# Patient Record
Sex: Female | Born: 1955 | Race: White | Hispanic: No | Smoking: Never smoker
Health system: Southern US, Community
[De-identification: ages and names within clinical notes are randomized; demographics above are authoritative.]

## PROBLEM LIST (undated history)

## (undated) DIAGNOSIS — E119 Type 2 diabetes mellitus without complications: Secondary | ICD-10-CM

## (undated) DIAGNOSIS — R519 Headache, unspecified: Secondary | ICD-10-CM

## (undated) DIAGNOSIS — D649 Anemia, unspecified: Secondary | ICD-10-CM

## (undated) DIAGNOSIS — K219 Gastro-esophageal reflux disease without esophagitis: Secondary | ICD-10-CM

## (undated) DIAGNOSIS — E785 Hyperlipidemia, unspecified: Secondary | ICD-10-CM

## (undated) DIAGNOSIS — N200 Calculus of kidney: Secondary | ICD-10-CM

## (undated) DIAGNOSIS — I10 Essential (primary) hypertension: Secondary | ICD-10-CM

## (undated) DIAGNOSIS — F419 Anxiety disorder, unspecified: Secondary | ICD-10-CM

## (undated) DIAGNOSIS — M1990 Unspecified osteoarthritis, unspecified site: Secondary | ICD-10-CM

## (undated) DIAGNOSIS — Z87442 Personal history of urinary calculi: Secondary | ICD-10-CM

## (undated) DIAGNOSIS — R569 Unspecified convulsions: Secondary | ICD-10-CM

## (undated) DIAGNOSIS — E039 Hypothyroidism, unspecified: Secondary | ICD-10-CM

## (undated) HISTORY — PX: APPENDECTOMY: SHX54

## (undated) NOTE — ED Notes (Signed)
 Formatting of this note might be different from the original. Patient is not in the emergency department. AMA completed. Electronically signed by Jeannetta Passy L at 05/24/2024 11:27 PM CDT Electronically signed by Jeannetta Passy L at 05/24/2024 11:31 PM CDT

## (undated) NOTE — ED Notes (Signed)
 Formatting of this note might be different from the original. Bed: AC19 Expected date:  Expected time:  Means of arrival:  Comments: [place], from [facility] Electronically signed by Jasmine Marjorie HERO, RN at 05/25/2024  1:23 AM CDT

## (undated) NOTE — ED Notes (Signed)
 Formatting of this note might be different from the original. Pt is awake and alert GCS 15. Breathing is regular and nonlabored. Skin is warm and dry. Gait is steady with no assistance. Proper discharge clothing. Discharge teaching successful as evidence by no further questions/concerns/needs. Pt ready for discharge.   Electronically signed by Pauline Hedge, RN at 05/25/2024  1:57 AM CDT

## (undated) NOTE — ED Provider Notes (Signed)
 Formatting of this note is different from the original. ED Attending Note  Patient seen as a team with Dr. Franchot and [name] who has also contributed to this note.  History: [name] is a 42 year old female with a past medical history as below is presenting to the ED c/o right wrist pain s/p fall. Pt is from [place] and tripped over a curb and fell onto her right wrist extending it earlier today. Pt denies LOC or blood thinner use. Pt was able to ambulate after the fall.   Patient information was obtained primarily from the patient and past medical records History/Exam limitations: none Interpretor services: N\A  Past Medical History[1] Past Surgical History[2] Social History   Socioeconomic History   Marital status: Widowed    Spouse name: Not on file   Number of children: Not on file   Years of education: Not on file   Highest education level: Not on file  Occupational History   Not on file  Tobacco Use   Smoking status: Never   Smokeless tobacco: Never  Vaping Use   Vaping status: Never Used  Substance and Sexual Activity   Alcohol use: Never   Drug use: Never   Sexual activity: Not on file  Other Topics Concern   Not on file  Social History Narrative   Not on file   Social Drivers of Health   Financial Resource Strain: Low Risk  (04/03/2024)   Received from [facility]   Overall Financial Resource Strain (CARDIA)    Difficulty of Paying Living Expenses: Not hard at all  Food Insecurity: No Food Insecurity (04/03/2024)   Received from [facility]   Hunger Vital Sign    Worried About Running Out of Food in the Last Year: Never true    Ran Out of Food in the Last Year: Never true  Transportation Needs: No Transportation Needs (04/03/2024)   Received from [facility] - Transportation    In the past 12 months, has lack of transportation kept you from medical appointments or from getting medications?:  No    Lack of Transportation (Non-Medical): No  Stress: Not on file  Housing Stability: Low Risk  (04/03/2024)   Received from [facility]   Housing Stability Vital Sign    Unable to Pay for Housing in the Last Year: No    Number of Times Moved in the Last Year: 0    Homeless in the Last Year: No   Review of Systems: Review of Systems  Constitutional:  Negative for chills, fever and malaise/fatigue.  HENT:  Negative for congestion, ear pain and sore throat.   Eyes:  Negative for blurred vision, pain and discharge.  Respiratory:  Negative for cough, shortness of breath and wheezing.   Cardiovascular:  Negative for chest pain and palpitations.  Gastrointestinal:  Negative for abdominal pain, diarrhea, nausea and vomiting.  Genitourinary:  Negative for dysuria, frequency and hematuria.  Musculoskeletal:  Positive for joint pain. Negative for back pain, myalgias and neck pain.       R wrist pain   Skin:  Negative for itching and rash.  Neurological:  Negative for dizziness, tingling, loss of consciousness, weakness and headaches.  Psychiatric/Behavioral:  Negative for depression and suicidal ideas.     No data found. Exam: Physical Exam Vitals and nursing note reviewed.  Constitutional:      General: She is not in acute distress. HENT:  Head: Normocephalic and atraumatic.     Mouth/Throat:     Lips: Pink.     Mouth: Mucous membranes are moist.  Eyes:     Extraocular Movements: Extraocular movements intact.     Conjunctiva/sclera: Conjunctivae normal.     Pupils: Pupils are equal, round, and reactive to light.  Cardiovascular:     Rate and Rhythm: Normal rate and regular rhythm.     Pulses:          Radial pulses are 2+ on the right side and 2+ on the left side.       Dorsalis pedis pulses are 2+ on the right side and 2+ on the left side.     Heart sounds: No murmur heard.    No friction rub. No gallop.  Pulmonary:     Effort: Pulmonary effort is normal.  No respiratory distress.     Breath sounds: Normal breath sounds. No wheezing.  Abdominal:     General: Abdomen is flat. Bowel sounds are normal. There is no distension.     Palpations: Abdomen is soft.     Tenderness: There is no abdominal tenderness.  Musculoskeletal:        General: No deformity. Normal range of motion.     Cervical back: Normal range of motion and neck supple.     Comments: Mild TTP over r wrist. No anatomical snuff box TTP. No acute focal neurological deficits.   Skin:    General: Skin is warm and dry.     Findings: No bruising.  Neurological:     Mental Status: She is alert and oriented to person, place, and time.     Comments: MAE spontaneously and equally.  Psychiatric:        Mood and Affect: Mood normal.        Behavior: Behavior normal.   Medical Decision Making:  40. 52 year old female with above history brought for evaluation of right wrist pain s/p fall.    DDX: ICH vs fx vs dislocation vs soft tissue injury vs other  Plan: review imaging, place in wrist brace and dc with PCP follow up  See below for further orders/plan  Data Review: Amount and/or Complexity of Data Reviewed: Patient information was obtained primarily from the patient and past medical records  Social determinants of health: No - Limiting social determinants of health: None.    Amount and/or Complexity of Data Reviewed: - medical complexity: medical complexity, Triage notes and available nursing notes reviewed , Tests in the radiology section of CPT: ordered and independent interpretation, Independent visualization of images: yes, and Review and summarize past medical records: yes   - Monitoring: The patient's Cardiac Monitor Rhythm was interpreted by me.  The telemetry monitor showed NSR. This is interpreted as normal.  The patient's Oxygen Saturation Monitor was interpreted by me. The reading was 99%. The patient was on room air at the time of the reading. This is interpreted  as normal.  All Labs/Imaging/ECG, other diagnostics independently interpreted by me.  - LABS: Labs Reviewed - No data to display  - IMAGING: CT HEAD WO CONTRAST  Final Result  PROCEDURE:  CT HEAD WO CONTRAST, CT FACIAL BONES WO CONTRAST, CT CERVICAL  SPINE WO CONTRAST, DATE/TIME OF EXAM:  05/24/2024 8:21 PM, LOCATION  [facility]   INDICATION:  [email or web address] Casino, initial encounter   ADDITIONAL CLINICAL INFORMATION:  Ordering Provider Reason For Exam:  r/o fx, bleed (accession [id number]),  r/o fx (accession  [id number])  Technologist Note:  Additional:    EXAMINATION:    1. Computed tomography (CT) of the head without contrast  2. CT of the maxillofacial bones, orbits, and paranasal sinuses without  contrast  3. CT of the cervical spine without contrast    TECHNIQUE: CT of the head, cervical spine, and maxillofacial bones,   orbits,  and paranasal sinuses was performed without contrast according to standard  protocol.   COMPARISON: No prior study is available for comparison at the time of this  dictation.   FINDINGS:   Head:   No acute intracranial hemorrhage or intra- or extra-axial fluid   collections  are identified. The ventricles are of normal size, shape, and morphology.  The basal cisterns are patent. No mass effect or midline shift is seen.   The  gray-white matter differentiation is normal. There is vascular  calcification of the carotid siphons.    No acute calvarial fracture is identified.   Maxillofacial:   No soft tissue abnormality is identified.    The orbits including the globes, optic nerves, retrobulbar fat and  extraocular muscles appear normal. The paranasal sinuses are clear. The  hard palate, mandible, and temporomandibular joints appear normal. The  mastoid air cells are clear.    Cervical spine:   No soft tissue abnormality is identified.    The cervical spine is straightened with loss of cervical lordosis. The   prevertebral soft tissue is normal in thickness. The mineralization of the  bones is normal. Vertebral bodies are normal in height without evidence of  acute fracture. Other than middle atlantoaxial joint osteoarthritis, the  craniocervical junction appears normal. 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 The central canal is patent. There  are varying degrees of mild facet osteoarthritis. There are varying   degrees  of mild to moderate multilevel uncovertebral joint osteoarthritis with the  same degree of neural foraminal stenosis at these levels.    IMPRESSION:   1. No acute intracranial process.   2. No acute facial bone fractures identified.   3. No evidence of acute fracture in the cervical spine.   > Interpreting Provider: Pixie Call, MD on 05/24/2024 9:01 PM   CT CERVICAL SPINE WO CONTRAST  Final Result  PROCEDURE:  CT HEAD WO CONTRAST, CT FACIAL BONES WO CONTRAST, CT CERVICAL  SPINE WO CONTRAST, DATE/TIME OF EXAM:  05/24/2024 8:21 PM, LOCATION  [facility]   INDICATION:  [email or web address] Casino, initial encounter   ADDITIONAL CLINICAL INFORMATION:  Ordering Provider Reason For Exam:  r/o fx, bleed (accession [id number]),  r/o fx (accession [id number])  Technologist Note:  Additional:    EXAMINATION:    1. Computed tomography (CT) of the head without contrast  2. CT of the maxillofacial bones, orbits, and paranasal sinuses without  contrast  3. CT of the cervical spine without contrast    TECHNIQUE: CT of the head, cervical spine, and maxillofacial bones,   orbits,  and paranasal sinuses was performed without contrast according to standard  protocol.   COMPARISON: No prior study is available for comparison at the time of this  dictation.   FINDINGS:   Head:   No acute intracranial hemorrhage or intra- or extra-axial fluid   collections  are identified. The ventricles are of normal size, shape, and morphology.  The basal  cisterns are patent. No mass effect or midline shift is seen.   The  gray-white matter differentiation is normal. There is vascular  calcification of the carotid siphons.    No acute calvarial fracture is identified.   Maxillofacial:   No soft tissue abnormality is identified.    The orbits including the globes, optic nerves, retrobulbar fat and  extraocular muscles appear normal. The paranasal sinuses are clear. The  hard palate, mandible, and temporomandibular joints appear normal. The  mastoid air cells are clear.    Cervical spine:   No soft tissue abnormality is identified.    The cervical spine is straightened with loss of cervical lordosis. The  prevertebral soft tissue is normal in thickness. The mineralization of the  bones is normal. Vertebral bodies are normal in height without evidence of  acute fracture. Other than middle atlantoaxial joint osteoarthritis, the  craniocervical junction appears normal. 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 The central canal is patent. There  are varying degrees of mild facet osteoarthritis. There are varying   degrees  of mild to moderate multilevel uncovertebral joint osteoarthritis with the  same degree of neural foraminal stenosis at these levels.    IMPRESSION:   1. No acute intracranial process.   2. No acute facial bone fractures identified.   3. No evidence of acute fracture in the cervical spine.   > Interpreting Provider: Pixie Call, MD on 05/24/2024 9:01 PM   CT Facial Bones Wo Contrast  Final Result  PROCEDURE:  CT HEAD WO CONTRAST, CT FACIAL BONES WO CONTRAST, CT CERVICAL  SPINE WO CONTRAST, DATE/TIME OF EXAM:  05/24/2024 8:21 PM, LOCATION  [facility]   INDICATION:  [email or web address] Casino, initial encounter   ADDITIONAL CLINICAL INFORMATION:  Ordering Provider Reason For Exam:  r/o fx, bleed (accession [id number]),  r/o fx (accession [id number])  Technologist Note:   Additional:    EXAMINATION:    1. Computed tomography (CT) of the head without contrast  2. CT of the maxillofacial bones, orbits, and paranasal sinuses without  contrast  3. CT of the cervical spine without contrast    TECHNIQUE: CT of the head, cervical spine, and maxillofacial bones,   orbits,  and paranasal sinuses was performed without contrast according to standard  protocol.   COMPARISON: No prior study is available for comparison at the time of this  dictation.   FINDINGS:   Head:   No acute intracranial hemorrhage or intra- or extra-axial fluid   collections  are identified. The ventricles are of normal size, shape, and morphology.  The basal cisterns are patent. No mass effect or midline shift is seen.   The  gray-white matter differentiation is normal. There is vascular  calcification of the carotid siphons.    No acute calvarial fracture is identified.   Maxillofacial:   No soft tissue abnormality is identified.    The orbits including the globes, optic nerves, retrobulbar fat and  extraocular muscles appear normal. The paranasal sinuses are clear. The  hard palate, mandible, and temporomandibular joints appear normal. The  mastoid air cells are clear.    Cervical spine:   No soft tissue abnormality is identified.    The cervical spine is straightened with loss of cervical lordosis. The  prevertebral soft tissue is normal in thickness. The mineralization of the  bones is normal. Vertebral bodies are normal in height without evidence of  acute fracture. Other than middle atlantoaxial joint osteoarthritis, the  craniocervical junction appears normal. 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 The central canal is patent. There  are varying  degrees of mild facet osteoarthritis. There are varying   degrees  of mild to moderate multilevel uncovertebral joint osteoarthritis with the  same degree of neural foraminal stenosis at these  levels.    IMPRESSION:   1. No acute intracranial process.   2. No acute facial bone fractures identified.   3. No evidence of acute fracture in the cervical spine.   > Interpreting Provider: Pixie Call, MD on 05/24/2024 9:01 PM   XR WRIST RIGHT 3VW OR MORE    (Results Pending)  XR HAND RIGHT 3VW OR MORE    (Results Pending)  XR CHEST 2VW    (Results Pending)   CT HEAD WO CONTRAST Result Date: 05/24/2024 IMPRESSION: 1. No acute intracranial process. 2. No acute facial bone fractures identified. 3. No evidence of acute fracture in the cervical spine. > Interpreting Provider: Pixie Call, MD on 05/24/2024 9:01 PM  CT CERVICAL SPINE WO CONTRAST Result Date: 05/24/2024 IMPRESSION: 1. No acute intracranial process. 2. No acute facial bone fractures identified. 3. No evidence of acute fracture in the cervical spine. > Interpreting Provider: Pixie Call, MD on 05/24/2024 9:01 PM  CT Facial Bones Wo Contrast Result Date: 05/24/2024 IMPRESSION: 1. No acute intracranial process. 2. No acute facial bone fractures identified. 3. No evidence of acute fracture in the cervical spine. > Interpreting Provider: Pixie Call, MD on 05/24/2024 9:01 PM  - MEDS:  Medications  acetaminophen  (Tylenol ) tablet 1,000 mg (1,000 mg Oral $ Given 05/24/24 1957)   ED COURSE: All Labs/Imaging/ECG, other diagnostics independently interpreted by me.  ED Course as of 05/25/24 0457  Fri May 25, 2024  0149 I have reviewed her diagnostic findings and she has had an opportunity to ask me any questions she has about care, diagnosis and discharge plan. Patient is comfortable with the discharge plan. She will follow up as directed and will return to the ER if her condition worsens or she develops other urgent concerns. [VV]   ED Course User Index [VV] Courtland Elden SAILOR    Clinical Impressions as of 05/25/24 0457  Fall, initial encounter  Right hand pain  Hypertension, unspecified type   Consult No  Procedure done at this  time No  Ultrasound done at this time No  Medications given in ED: Yes - see MAR  cct   Conclusion and Disposition:   Acute problems: r wrist pain, fall  Exacerbations of chronic problems: n/a  Systemic issues: n/a   Consultations in the ED: n/a  Medication changes: n/a  Follow up: PCP    Orders Placed This Encounter   XR WRIST RIGHT 3VW OR MORE   XR HAND RIGHT 3VW OR MORE   XR CHEST 2VW   CT HEAD WO CONTRAST   CT CERVICAL SPINE WO CONTRAST   CT Facial Bones Wo Contrast   acetaminophen  (Tylenol ) tablet 1,000 mg   Medications  acetaminophen  (Tylenol ) tablet 1,000 mg (1,000 mg Oral $ Given 05/24/24 1957)   Clinical Impression: 1. Fall, initial encounter   2. Right hand pain   3. Hypertension, unspecified type    Disposition:  Discharged   By signing my name below, I, Elden Courtland, attest that this documentation has been prepared under the direction and in the presence of Dr. Hunt. Signed: Varun Vadhera, Scribe.  I, Dr. Hunt, personally performed the services described in this documentation. All medical record entries made by the scribe were at my direction and in my presence. I have reviewed the chart and agree that the  record reflects my personal performance and is accurate and complete. Electronically signed: Dr. Hunt.    [1] No past medical history on file. [2] No past surgical history on file.  Electronically signed by Hunt Elspeth LABOR, MD at 05/25/2024  5:51 AM CDT

## (undated) NOTE — ED Notes (Signed)
 Formatting of this note is different from the original. Medical Screening Exam  05/24/2024 7:46 PM    Provider contact with the patient [name] FMW:[id number]  CC: Fall (Pt bibSELF after tripping over a buckled part of the sidewalk earlier this morning.. Patient c/o R wrist and R rib pain. Denis LOC or BT use. Pt arrives A&Ox4, VSS, ambulatory in triage. )  **Chief complaint narrative was entered by triage nurse, not by provider**  Provider in Triage HPI:  [name] is a 47 year old female PMH as noted below who presents with fall this morning. States she tripped over the sidewalk. Pt states she fell forward hitting the side of her head. Pt states was able to catch herself with her hands but has increased pain in her right wrist and right side of her chest along her ribs.  Denies LOC, blood thinner.  Denies any other medical history.  Patient states she is from the [place] and is currently traveling and visiting [place].  Denies any other symptoms at this time.  Denies taking medications for pain. Last tetanus was 4 yrs ago     Limited Chart History:  Past Medical History[1]  Past Surgical History[2]  Medications[3]  Allergies[4]  PCP:  No primary care provider on file.  (Above may be pending completion)   Review of Systems: Primary System Noted in HPI. All other systems reviewed and are negative.  Vital Signs reviewed in Triage BP 152/79   Pulse 83   Temp 97.6 F (36.4 C)   Resp 17   Ht 1.676 m (5' 6)   Wt 93 kg (205 lb)   SpO2 99%   Pertinent Physical Findings:  Constitutional: vitals as above, sitting comfortably in chair in no acute distress, friendly, calm, well groomed Head: Head normocephalic, small abrasion to the R lateral aspect of eyebrow not bleeding, tender to R frontal lobe  Eyes: conjunctiva clear ENT: no rhinorrhea Neck: neck supple Resp: respirations even and unlabored, lungs clear bilaterally, no wheezing, stridor,  rhonchi  CV: Heart RRR, no m/c/r/g, tender to R side of chest  Abd: nondistended,nontender Skin: warm, dry,color normal for ethnicity MSK: ambulatory, moves all extremities, decreased ROM of R wrist with tenderness to lateral aspect radiating to hand. Full ROM of R fingers  Neuro: A&O x 3 Psych: Normal affect  **Complete physical exam is limited due to patient sitting in up right position in chair**  MDM: I have reviewed all lab and imaging resulted ordered during this visit and available at the time of this note.  Triage notes and available nursing notes reviewed.  Previous medical record reviewed when available.  Management options include but not limited to: physical exam, laboratory testing, discussion with other providers.   MEDICATIONS FOR CURRENT ENCOUNTER:  SCHEDULED MEDICATIONS:                 No current facility-administered medications for this encounter.  CONTINUOUS MEDICATIONS:               No current facility-administered medications for this encounter.  PRN MEDICATIONS:                         No current facility-administered medications for this encounter.  Clinical Impression:  1.mechanical fall, R wrist pain, R frontal lobe pain   Based on the Medical Screening Exam performed and diagnostic tests at this time, further evaluation is indicated and will be performed.  Patient will  be transferred to a main ED room when one is available and care will be transferred to ER provider.  Harrold Motto, PA-C     [1] No past medical history on file. [2] No past surgical history on file. [3]  No current facility-administered medications for this encounter.   No current outpatient medications on file.  [4] No Known Allergies  Electronically signed by Motto Harrold, PA-C at 05/24/2024  7:58 PM CDT

---

## 1986-11-29 HISTORY — PX: LAPAROSCOPY FOR ECTOPIC PREGNANCY: SUR765

## 2005-03-11 ENCOUNTER — Ambulatory Visit: Payer: Self-pay | Admitting: Unknown Physician Specialty

## 2005-11-29 HISTORY — PX: ABDOMINAL HYSTERECTOMY: SHX81

## 2005-11-29 LAB — CONVERTED CEMR LAB: Pap Smear: NORMAL

## 2006-06-21 ENCOUNTER — Ambulatory Visit: Payer: Self-pay | Admitting: Unknown Physician Specialty

## 2006-08-24 ENCOUNTER — Encounter (INDEPENDENT_AMBULATORY_CARE_PROVIDER_SITE_OTHER): Payer: Self-pay | Admitting: Specialist

## 2006-08-24 ENCOUNTER — Ambulatory Visit (HOSPITAL_COMMUNITY): Admission: RE | Admit: 2006-08-24 | Discharge: 2006-08-25 | Payer: Self-pay | Admitting: Obstetrics and Gynecology

## 2007-01-02 ENCOUNTER — Ambulatory Visit: Payer: Self-pay | Admitting: Gastroenterology

## 2007-01-02 ENCOUNTER — Encounter: Payer: Self-pay | Admitting: Family Medicine

## 2007-03-08 ENCOUNTER — Ambulatory Visit: Payer: Self-pay | Admitting: Unknown Physician Specialty

## 2007-12-28 ENCOUNTER — Ambulatory Visit: Payer: Self-pay | Admitting: Unknown Physician Specialty

## 2008-06-04 ENCOUNTER — Ambulatory Visit: Payer: Self-pay | Admitting: Unknown Physician Specialty

## 2008-12-09 ENCOUNTER — Ambulatory Visit: Payer: Self-pay | Admitting: Family Medicine

## 2008-12-09 DIAGNOSIS — Z87442 Personal history of urinary calculi: Secondary | ICD-10-CM | POA: Insufficient documentation

## 2008-12-09 DIAGNOSIS — G43009 Migraine without aura, not intractable, without status migrainosus: Secondary | ICD-10-CM | POA: Insufficient documentation

## 2009-02-10 ENCOUNTER — Ambulatory Visit: Payer: Self-pay | Admitting: Family Medicine

## 2009-02-10 LAB — CONVERTED CEMR LAB
ALT: 22 units/L (ref 0–35)
AST: 22 units/L (ref 0–37)
Albumin: 3.9 g/dL (ref 3.5–5.2)
Alkaline Phosphatase: 83 units/L (ref 39–117)
BUN: 21 mg/dL (ref 6–23)
CO2: 32 meq/L (ref 19–32)
Chloride: 105 meq/L (ref 96–112)
Cholesterol: 167 mg/dL (ref 0–200)
Creatinine, Ser: 0.8 mg/dL (ref 0.4–1.2)
Glucose, Bld: 84 mg/dL (ref 70–99)
Potassium: 4.4 meq/L (ref 3.5–5.1)
Triglycerides: 51 mg/dL (ref 0–149)

## 2009-02-11 ENCOUNTER — Ambulatory Visit: Payer: Self-pay | Admitting: Family Medicine

## 2009-03-03 ENCOUNTER — Encounter: Payer: Self-pay | Admitting: Family Medicine

## 2009-03-03 ENCOUNTER — Ambulatory Visit: Payer: Self-pay | Admitting: Family Medicine

## 2009-03-05 ENCOUNTER — Encounter (INDEPENDENT_AMBULATORY_CARE_PROVIDER_SITE_OTHER): Payer: Self-pay | Admitting: *Deleted

## 2009-04-22 ENCOUNTER — Ambulatory Visit: Payer: Self-pay | Admitting: Family Medicine

## 2009-07-22 ENCOUNTER — Telehealth: Payer: Self-pay | Admitting: Family Medicine

## 2010-01-23 DIAGNOSIS — Z860100 Personal history of colon polyps, unspecified: Secondary | ICD-10-CM | POA: Insufficient documentation

## 2010-02-19 ENCOUNTER — Encounter: Payer: Self-pay | Admitting: Family Medicine

## 2010-03-12 ENCOUNTER — Ambulatory Visit: Payer: Self-pay | Admitting: Gastroenterology

## 2010-03-12 ENCOUNTER — Encounter: Payer: Self-pay | Admitting: Family Medicine

## 2010-05-11 ENCOUNTER — Telehealth: Payer: Self-pay | Admitting: Family Medicine

## 2010-06-02 ENCOUNTER — Encounter: Payer: Self-pay | Admitting: Family Medicine

## 2010-06-02 ENCOUNTER — Ambulatory Visit: Payer: Self-pay | Admitting: Family Medicine

## 2010-06-04 ENCOUNTER — Ambulatory Visit: Payer: Self-pay | Admitting: Family Medicine

## 2010-06-04 ENCOUNTER — Encounter: Payer: Self-pay | Admitting: Family Medicine

## 2010-06-09 ENCOUNTER — Encounter: Payer: Self-pay | Admitting: Family Medicine

## 2010-08-14 ENCOUNTER — Telehealth (INDEPENDENT_AMBULATORY_CARE_PROVIDER_SITE_OTHER): Payer: Self-pay | Admitting: *Deleted

## 2010-08-18 ENCOUNTER — Ambulatory Visit: Payer: Self-pay | Admitting: Family Medicine

## 2010-08-19 LAB — CONVERTED CEMR LAB
ALT: 25 units/L (ref 0–35)
AST: 33 units/L (ref 0–37)
Alkaline Phosphatase: 83 units/L (ref 39–117)
BUN: 20 mg/dL (ref 6–23)
Chloride: 102 meq/L (ref 96–112)
Cholesterol: 193 mg/dL (ref 0–200)
GFR calc non Af Amer: 71.13 mL/min (ref >60)
Glucose, Bld: 87 mg/dL (ref 70–99)
LDL Cholesterol: 123 mg/dL — ABNORMAL HIGH (ref 0–99)
Potassium: 4.7 meq/L (ref 3.5–5.1)
Sodium: 139 meq/L (ref 135–145)
Total Bilirubin: 0.8 mg/dL (ref 0.3–1.2)
VLDL: 8.4 mg/dL (ref 0.0–40.0)

## 2010-08-25 ENCOUNTER — Ambulatory Visit: Payer: Self-pay | Admitting: Family Medicine

## 2010-08-25 LAB — CONVERTED CEMR LAB

## 2010-09-25 ENCOUNTER — Encounter (INDEPENDENT_AMBULATORY_CARE_PROVIDER_SITE_OTHER): Payer: Self-pay | Admitting: *Deleted

## 2010-12-29 NOTE — Consult Note (Signed)
Summary: [facility]  [facility]   Imported By: Lanelle Bal 03/03/2010 14:22:36  _____________________________________________________________________  External Attachment:    Type:   Image     Comment:   External Document

## 2010-12-29 NOTE — Miscellaneous (Signed)
Summary: FLU VAC ([facility])  Clinical Lists Changes  Observations: Added new observation of FLU VAX: Historical([facility]) (09/04/2010 13:37)      Immunization History:  Influenza Immunization History:    Influenza:  historical([facility]) (09/04/2010)

## 2010-12-29 NOTE — Progress Notes (Signed)
Summary: needs mammogram  Phone Note Call from Patient Call back at Home Phone [phone]   Caller: Patient Call For: Kerby Nora MD Summary of Call: Patient says that it is time for her yearly mammogram and needs an order for it. She goes to [facility]. Please advise.  Initial call taken by: Melody Comas,  May 11, 2010 2:20 PM  Follow-up for Phone Call        Referral sent.  Follow-up by: Kerby Nora MD,  May 11, 2010 2:21 PM

## 2010-12-29 NOTE — Progress Notes (Signed)
----   Converted from flag ---- ---- 08/13/2010 10:00 AM, Kerby Nora MD wrote: CMET, lipids Dx v77.91  ---- 08/13/2010 9:40 AM, Liane Comber CMA (AAMA) wrote: Lab orders please! Good Morning! This pt is scheduled for cpx labs Tuesday, which labs to draw and dx codes to use? Thanks Tasha ------------------------------

## 2010-12-29 NOTE — Assessment & Plan Note (Signed)
Summary: CPX/CLE   Vital Signs:  Patient profile:   55 year old female Height:      67 inches Weight:      186.0 pounds BMI:     29.24 Temp:     98.1 degrees F oral Pulse rate:   80 / minute Pulse rhythm:   regular BP sitting:   100 / 60  (left arm) Cuff size:   regular  Vitals Entered By: Benny Lennert CMA Duncan Dull) (August 25, 2010 11:06 AM)  History of Present Illness: Chief complaint cpx  The patient is here for annual wellness exam and preventative care.    20 lb weight gain in last year. Now eating better.. in last 2 weeks walking 3-4 miles daily.  Migraine .Tonia Ghent a month...now off keppra..using frova as needed only.   Problems Prior to Update: 1)  Colonic Polyps, Adenomatous, Hx of  (ICD-V12.72) 2)  Routine Gynecological Examination  (ICD-V72.31) 3)  Well Adult Exam  (ICD-V70.0) 4)  Other Screening Mammogram  (ICD-V76.12) 5)  Screening For Lipoid Disorders  (ICD-V77.91) 6)  Family History Breast Cancer 1st Degree Relative <50  (ICD-V16.3) 7)  Family History Diabetes 1st Degree Relative  (ICD-V18.0) 8)  Hx of Nephrolithiasis, Hx of  (ICD-V13.01) 9)  Migraine, Common  (ICD-346.10)  Current Medications (verified): 1)  Frova 2.5 Mg Tabs (Frovatriptan Succinate) .... At Onset of Migraine  Allergies (verified): No Known Drug Allergies  Past History:  Past medical, surgical, family and social histories (including risk factors) reviewed, and no changes noted (except as noted below).  Past Surgical History: Reviewed history from 12/09/2008 and no changes required. Hysterectomy, partial Has both ovaries; for uterine prolapse 2007 Appendectomy eptopic preg 1988   Family History: Reviewed history from 12/09/2008 and no changes required. father: neck cancer, unknow type but very aggressive mother:  CVA, Dm, HTN Family History Diabetes 1st degree relative Family History High cholesterol Family History Hypertension sister breast cancer Family History Breast  cancer 1st degree relative <50  Social History: Reviewed history from 12/09/2008 and no changes required. 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 CV:  Denies chest pain or discomfort. Resp:  Denies shortness of breath. GI:  Denies abdominal pain, bloody stools, constipation, and diarrhea. GU:  Denies abnormal vaginal bleeding and dysuria.  Physical Exam  General:  overweight appearing female in NAD Eyes:  No corneal or conjunctival inflammation noted. EOMI. Perrla. Funduscopic exam benign, without hemorrhages, exudates or papilledema. Vision grossly normal. Nose:  External nasal examination shows no deformity or inflammation. Nasal mucosa are pink and moist without lesions or exudates. Mouth:  Oral mucosa and oropharynx without lesions or exudates.  Teeth in good repair. Neck:  no carotid bruit or thyromegaly no cervical or supraclavicular lymphadenopathy  Breasts:  No mass, nodules, thickening, tenderness, bulging, retraction, inflamation, nipple discharge or skin changes noted.   Lungs:  Normal respiratory effort, chest expands symmetrically. Lungs are clear to auscultation, no crackles or wheezes. Heart:  Normal rate and regular rhythm. S1 and S2 normal without gallop, murmur, click, rub or other extra sounds. Abdomen:  Bowel sounds positive,abdomen soft and non-tender without masses, organomegaly or hernias noted. Msk:  No deformity or scoliosis noted of thoracic or lumbar spine.   Pulses:  R and L posterior tibial pulses are full and equal bilaterally  Extremities:  no edmea  Neurologic:  No cranial nerve  deficits noted. Station and gait are normal. Sensory, motor and coordinative functions appear intact. Skin:  Intact without suspicious lesions or rashes Psych:  Cognition and judgment  appear intact. Alert and cooperative with normal attention span and concentration. No apparent delusions, illusions, hallucinations   Impression & Recommendations:  Problem # 1:  WELL ADULT EXAM (ICD-V70.0) The patient's preventative maintenance and recommended screening tests for an annual wellness exam were reviewed in full today. Brought up to date unless services declined.  Counselled on the importance of diet, exercise, and its role in overall health and mortality. The patient's FH and SH was reviewed, including their home life, tobacco status, and drug and alcohol status.     Problem # 2:  ROUTINE GYNECOLOGICAL EXAMINATION (ICD-V72.31) DVE no pap.   Complete Medication List: 1)  Frova 2.5 Mg Tabs (Frovatriptan succinate) .... At onset of migraine  Current Allergies (reviewed today): No known allergies    Past Surgical History:    Reviewed history from 12/09/2008 and no changes required:       Hysterectomy, partial Has both ovaries; for uterine prolapse 2007       Appendectomy       eptopic preg 1988    Flu Vaccine Next Due:  Refused Last PAP:  Normal (11/29/2005 10:24:14 AM) PAP Result Date:  08/25/2010 PAP Result:  DVE no pap, has B ovaries

## 2010-12-29 NOTE — Procedures (Signed)
Summary: Colonoscopy by Dr.Paul [facility]  Colonoscopy by Dr.Paul Oh,[facility]   Imported By: Beau Fanny 03/17/2010 15:24:35  _____________________________________________________________________  External Attachment:    Type:   Image     Comment:   External Document  Appended Document: Orders Update    Clinical Lists Changes  Observations: Added new observation of COLONNXTDUE: 03/13/2015 (03/18/2010 23:40) Added new observation of LST COLON DT: 03/12/2010 (03/12/2010 23:40) Added new observation of COLONOSCOPY: normal (03/12/2010 23:40)      Last Colonoscopy:  Adenomatous Polyp (01/03/2007 10:25:44 AM) Colonoscopy Result Date:  03/12/2010 Colonoscopy Result:  normal Colonoscopy Next Due:  5 yr

## 2011-04-16 NOTE — Discharge Summary (Signed)
[name], [name]            ACCOUNT NO.:  [id number]   MEDICAL RECORD NO.:  [id number]          PATIENT TYPE:  OIB   LOCATION:  9311                          FACILITY:  [facility]   PHYSICIAN:  Richardean Sale, M.D.   DATE OF BIRTH:  10/18/1956   DATE OF ADMISSION:  08/24/2006  DATE OF DISCHARGE:  08/25/2006                                 DISCHARGE SUMMARY   ADMITTING DIAGNOSES:  1. Uterine prolapse.  2. Symptomatic cystocele and rectocele.   POSTOPERATIVE DIAGNOSES:  1. Uterine prolapse.  2. Symptomatic cystocele and rectocele.  3. Status post total vaginal hysterectomy with anterior and posterior      repair.  4. Bradycardia.   HOSPITAL COURSE AND HISTORY OF PRESENT ILLNESS:  Please see the patient's  admission history and physical for details.  Briefly, this is a 55 year old  white female with symptomatic uterine prolapse, cystocele and rectocele who  underwent an uncomplicated total vaginal hysterectomy and anterior-posterior  repair on August 24, 2006.  In the operating room, it was noted that the  patient was bradycardic from the start of the procedure with heart rate in  the 40s.  Preoperatively she ran in the 50s to 60s.  She remained in the 40s  to 60s throughout her postoperative course but was completely asymptomatic  with stable blood pressures.  She was evaluated by cardiology during the  admission who thought this was simply sinus bradycardia and their evaluation  required no intervention while she was in the hospital.  From a surgical  standpoint she remained stable throughout her hospitalization.  On postop  day #1 she was ambulating well, was voiding without difficulty and her pain  was controlled with oral pain medications and she was tolerating a regular  diet.  She was subsequently discharged to home on postop day #1 a good  condition.   DISPOSITION:  To home.   CONDITION:  Improved.   FOLLOWUP:  She will follow up in 4 weeks for routine postoperative  check.   INSTRUCTIONS:  The patient is to call for any heavy vaginal bleeding, pain  not relieved with pain medications or inability or difficulty voiding.  In  addition she is to follow up with her primary care physician regarding the  bradycardia as instructed by the cardiologist.   MEDICATIONS:  1. Percocet 1-2 tabs p.o. q.4-6 hours p.r.n. pain.  2. Motrin 800 mg p.o. q.8 hours p.r.n.      Richardean Sale, M.D.  Electronically Signed     JW/MEDQ  D:  08/27/2006  T:  08/29/2006  Job:  [id number]

## 2011-04-16 NOTE — Op Note (Signed)
[name], [name]            ACCOUNT NO.:  [id number]   MEDICAL RECORD NO.:  [id number]          PATIENT TYPE:  AMB   LOCATION:  SDC                           [facility]ACILITY:  [facility]   PHYSICIAN:  [name], M.D.   DATE OF BIRTH:  October 27, 1956   DATE OF PROCEDURE:  08/24/2006  DATE OF DISCHARGE:                                 OPERATIVE REPORT   PREOPERATIVE DIAGNOSES:  1. Uterine prolapse.  2. Symptomatic cystocele.  3. Rectocele.   POSTOPERATIVE DIAGNOSES:  1. Uterine prolapse.  2. Symptomatic cystocele.  3. Rectocele.   PROCEDURES:  1. Total vaginal hysterectomy.  2. Anterior and posterior repair.   SURGEON:  [name], M.D.   ASSISTANT:  [name], M.D.   ANESTHESIA:  General.   COMPLICATIONS:  None.   ESTIMATED BLOOD LOSS:  150 cc.   OPERATIVE FINDINGS:  Normal-appearing uterus and cervix sent to pathology,  second-degree cystocele and third degree rectocele.   INDICATIONS:  This is a 55 year old para 1 white female who has had  progressively worsening symptoms complaining of a bulge in the vagina as  well as pelvic pressure.  She has uterine prolapse as well as a cystocele  and rectocele.  She presents today for total vaginal hysterectomy and  anterior and posterior repair.  Prior to procedure, the risks of the  procedure were reviewed with the patient in detail.  We discussed the risks  to include, but are not limited to, hemorrhage requiring transfusion,  infection, injury to the bowel or bladder which may require additional  surgery either at the time of this procedure or in the future.  Reviewed the  risk of anesthesia including DVT and pulmonary embolus.  We also discussed  the possibility of needing additional surgery in the future, given her early  age of onset of symptomatic prolapse.  The patient voiced understanding of  all of the above desires to proceed.  Informed was obtained before  proceeding to the OR.   PROCEDURE:  The patient  was taken operating room where she was given a  general anesthetic.  She was then prepped and draped in the usual sterile  fashion and placed in the dorsal lithotomy position.  A Foley catheter was  then placed.  Bimanual exam confirmed the presence of uterine prolapse.  The  cervix easily pulled down to the vagina with a tenaculum.  At this point, a  posterior weighted speculum was then placed into the vagina and the cervix  was then injected circumferentially with 1% lidocaine with epinephrine.  The  cervix was then incised circumferentially with a scalpel.  The anterior cul-  de-sac was then entered sharply and the posterior cul-de-sac was entered  sharply as well.  The uterosacral ligaments of both sides were then clamped,  transected and suture ligated.  The cardinal ligaments and broad ligaments  were cauterized with a LigaSure and transected and were hemostatic.  The  utero-ovarian ligaments were grasped with Heaney clamps, transected and were  doubly suture ligated and were hemostatic.  At this point, the pedicles were  inspected and all were hemostatic.  Any areas of bleeding along the  peritoneum or vaginal cuff were made hemostatic with the Bovie.  The  posterior vaginal cuff was made hemostatic with a running locked Vicryl  suture. A McCall culdoplasty suture was then placed to help prevent  formation of enterocele.  At this point, attention was then turned to the  anterior repair.  We used 1% lidocaine with epinephrine to inject the  anterior wall of the vagina.  The vaginal mucosa was then dissected off the  bladder using the Strully scissors.  The edges of the vaginal mucosal  incision were grasped with Allis clamps.  The edges of the endopelvic fascia  were then reapproximated in the midline with interrupted Vicryl suture  adequately reducing the cystocele.  Once this was done, the edges of the  vaginal mucosa were trimmed and the vaginal mucosa was reapproximated in the   midline with interrupted Vicryl sutures.  Any areas of bleeding were  cauterized with the Bovie.  At this point, the apex of the vaginal cuff was  then closed using interrupted Vicryl sutures.  The McCall's culdoplasty  suture was then tied.  The uterosacral ligatures were plicated together in  the midline and the remaining portion of the vaginal cuff was closed with  interrupted Vicryl sutures.  At this point, attention was then turned to the  rectocele.  The posterior wall of the vagina was then injected with 1%  lidocaine with epinephrine.  A wedge-shaped perineorrhaphy incision was then  made.  This excess tissue was then discarded.  The posterior vaginal mucosa  was then dissected off the underlying rectum with the Strully scissors.  The  edges of the vaginal muscle incision were grasped with Allis clamps.  The  endopelvic fascia was identified and a pursestring Vicryl suture was placed  to help reduce the rectocele followed by plicating the edges of the  endopelvic fascia in the midline to completely reduce the rectocele.  At  this point, the edge of the vaginal mucosa were trimmed and the tissue was  discarded.  The vaginal mucosa was then reapproximated in the midline using  interrupted Vicryl suture.  The perineorrhaphy incision was then repaired  with 3-0 chromic in a standard fashion.  Two fingers were then replaced into  the vagina.  There was adequate depth and adequate width of the vagina  noted.  The vagina was then packed with 2-inch plain gauze soaked in Estrace  cream and the procedure was then terminated.   The patient tolerated the procedure very well.  All sponge, lap, needle and  instrument counts were correct x2.  The patient was awakened from  anesthesia, taken of the dorsal lithotomy position and was transferred to  the recovery room awake and in stable condition.  There were no  complications.      [name], M.D. Electronically Signed     JW/MEDQ   D:  08/24/2006  T:  08/25/2006  Job:  [id number]

## 2011-07-06 ENCOUNTER — Telehealth: Payer: Self-pay | Admitting: *Deleted

## 2011-07-06 DIAGNOSIS — Z1231 Encounter for screening mammogram for malignant neoplasm of breast: Secondary | ICD-10-CM

## 2011-07-06 NOTE — Telephone Encounter (Signed)
Patient needs order for mammogram to [facility] in [place].  Says she would like to get in this week if possible.

## 2011-07-07 DIAGNOSIS — Z1231 Encounter for screening mammogram for malignant neoplasm of breast: Secondary | ICD-10-CM | POA: Insufficient documentation

## 2011-07-07 NOTE — Telephone Encounter (Signed)
done

## 2011-07-23 ENCOUNTER — Ambulatory Visit: Payer: Self-pay | Admitting: Family Medicine

## 2011-07-23 ENCOUNTER — Encounter: Payer: Self-pay | Admitting: Family Medicine

## 2012-06-27 ENCOUNTER — Other Ambulatory Visit: Payer: Self-pay

## 2012-07-04 ENCOUNTER — Encounter: Payer: Self-pay | Admitting: Family Medicine

## 2012-07-26 ENCOUNTER — Ambulatory Visit: Payer: Self-pay | Admitting: Family Medicine

## 2012-07-27 ENCOUNTER — Encounter: Payer: Self-pay | Admitting: Family Medicine

## 2012-08-24 ENCOUNTER — Telehealth: Payer: Self-pay | Admitting: Family Medicine

## 2012-08-24 DIAGNOSIS — Z13220 Encounter for screening for lipoid disorders: Secondary | ICD-10-CM

## 2012-08-24 NOTE — Telephone Encounter (Signed)
Message copied by Excell Seltzer on Thu Aug 24, 2012  9:12 AM ------      Message from: [place], [place] J      Created: Wed Aug 23, 2012  2:44 PM      Regarding: lab orders for, Tuesday 10.1.13       Patient is scheduled for CPX labs, please order future labs, Thanks , Camelia Eng

## 2012-08-29 ENCOUNTER — Other Ambulatory Visit (INDEPENDENT_AMBULATORY_CARE_PROVIDER_SITE_OTHER): Payer: 59

## 2012-08-29 DIAGNOSIS — Z13220 Encounter for screening for lipoid disorders: Secondary | ICD-10-CM

## 2012-08-29 LAB — COMPREHENSIVE METABOLIC PANEL
ALT: 18 U/L (ref 0–35)
AST: 24 U/L (ref 0–37)
BUN: 21 mg/dL (ref 6–23)
Calcium: 9.1 mg/dL (ref 8.4–10.5)
Chloride: 106 mEq/L (ref 96–112)
Creatinine, Ser: 0.8 mg/dL (ref 0.4–1.2)
GFR: 78.81 mL/min (ref >60.00)
Total Bilirubin: 0.6 mg/dL (ref 0.3–1.2)

## 2012-08-29 LAB — LIPID PANEL
Cholesterol: 165 mg/dL (ref 0–200)
HDL: 55.3 mg/dL (ref >39.00)
LDL Cholesterol: 99 mg/dL (ref 0–99)
Total CHOL/HDL Ratio: 3
Triglycerides: 53 mg/dL (ref 0.0–149.0)

## 2012-09-04 ENCOUNTER — Encounter: Payer: Self-pay | Admitting: Family Medicine

## 2012-09-05 ENCOUNTER — Ambulatory Visit (INDEPENDENT_AMBULATORY_CARE_PROVIDER_SITE_OTHER): Payer: 59 | Admitting: Family Medicine

## 2012-09-05 ENCOUNTER — Encounter: Payer: Self-pay | Admitting: Family Medicine

## 2012-09-05 VITALS — BP 114/70 | HR 72 | Temp 98.3°F | Ht 66.6 in | Wt 191.8 lb

## 2012-09-05 DIAGNOSIS — Z01419 Encounter for gynecological examination (general) (routine) without abnormal findings: Secondary | ICD-10-CM

## 2012-09-05 DIAGNOSIS — Z0000 Encounter for general adult medical examination without abnormal findings: Secondary | ICD-10-CM

## 2012-09-05 NOTE — Progress Notes (Signed)
Subjective:    Patient ID: [name], female    DOB: 1956/05/21, 56 y.o.   MRN: [id number]  HPI The patient is here for annual wellness exam and preventative care.    Migraine, well controlled:  Has only had one migraine every other month. She is treating easily with frova.  Reviewed labs in detail. Night before test she had donut.. Which may be causing 101 CBG.   Lab Results  Component Value Date   CHOL 165 08/29/2012   HDL 55.30 08/29/2012   LDLCALC 99 08/29/2012   TRIG 53.0 08/29/2012   CHOLHDL 3 08/29/2012   Wt Readings from Last 3 Encounters:  09/05/12 191 lb 12 oz (86.977 kg)  08/25/10 186 lb (84.369 kg)  04/22/09 168 lb 4 oz (76.318 kg)  Has been working on healthy lifestyle.Marland Kitchen Has lost 16 lbs in last 2 -3 months.  History   Social History  . Marital Status: Widowed    Spouse Name: N/A    Number of Children: 1  . Years of Education: N/A   Occupational History  . Government social research officer, part-time    Social History Main Topics  . Smoking status: Never Smoker   . Smokeless tobacco: Never Used  . Alcohol Use: Yes     wine occassionally  . Drug Use: No  . Sexually Active: None   Other Topics Concern  . None   Social History Narrative   Regular exercise:  Yes, strength training, walking 2 miles dailyDiet:  Fruit and veggies, some water, low fat diet, salads.     Review of Systems  Constitutional: Negative for fever, fatigue and unexpected weight change.  HENT: Negative for ear pain, congestion, sore throat, sneezing, trouble swallowing and sinus pressure.   Eyes: Negative for pain and itching.  Respiratory: Negative for cough, shortness of breath and wheezing.   Cardiovascular: Negative for chest pain, palpitations and leg swelling.  Gastrointestinal: Negative for nausea, abdominal pain, diarrhea, constipation and blood in stool.  Genitourinary: Negative for dysuria, hematuria, vaginal discharge, difficulty urinating and menstrual problem.  Skin: Negative for  rash.  Neurological: Negative for syncope, weakness, light-headedness, numbness and headaches.  Psychiatric/Behavioral: Negative for confusion and dysphoric mood. The patient is not nervous/anxious.        Objective:   Physical Exam  Constitutional: Vital signs are normal. She appears well-developed and well-nourished. She is cooperative.  Non-toxic appearance. She does not appear ill. No distress.  HENT:  Head: Normocephalic.  Right Ear: Hearing, tympanic membrane, external ear and ear canal normal.  Left Ear: Hearing, tympanic membrane, external ear and ear canal normal.  Nose: Nose normal.  Eyes: Conjunctivae normal, EOM and lids are normal. Pupils are equal, round, and reactive to light. No foreign bodies found.  Neck: Trachea normal and normal range of motion. Neck supple. Carotid bruit is not present. No mass and no thyromegaly present.  Cardiovascular: Normal rate, regular rhythm, S1 normal, S2 normal, normal heart sounds and intact distal pulses.  Exam reveals no gallop.   No murmur heard. Pulmonary/Chest: Effort normal and breath sounds normal. No respiratory distress. She has no wheezes. She has no rhonchi. She has no rales.  Abdominal: Soft. Normal appearance and bowel sounds are normal. She exhibits no distension, no fluid wave, no abdominal bruit and no mass. There is no hepatosplenomegaly. There is no tenderness. There is no rebound, no guarding and no CVA tenderness. No hernia.  Genitourinary: Vagina normal. No breast swelling, tenderness, discharge or bleeding. Pelvic exam was performed  with patient prone. There is no rash, tenderness or lesion on the right labia. There is no rash, tenderness or lesion on the left labia. Right adnexum displays no mass, no tenderness and no fullness. Left adnexum displays no mass, no tenderness and no fullness.       No uterus , no pap  Lymphadenopathy:    She has no cervical adenopathy.    She has no axillary adenopathy.  Neurological: She is  alert. She has normal strength. No cranial nerve deficit or sensory deficit.  Skin: Skin is warm, dry and intact. No rash noted.  Psychiatric: Her speech is normal and behavior is normal. Judgment normal. Her mood appears not anxious. Cognition and memory are normal. She does not exhibit a depressed mood.          Assessment & Plan:  The patient's preventative maintenance and recommended screening tests for an annual wellness exam were reviewed in full today. Brought up to date unless services declined.  Counselled on the importance of diet, exercise, and its role in overall health and mortality. The patient's FH and SH was reviewed, including their home life, tobacco status, and drug and alcohol status.   Hysterectomy, partial has both ovaries; for uterine prolapse 2007 DVE yearly, no pap. Mammogram: 07/26/12 nml Colon: 02/2010, Dr. Bluford Kaufmann, nml, but recommended repeat in 5 years, 2016. Vaccines: Uptodate with Tdap, flu (revceived at [facility]) DEXA: low risk for osteoporosis.. We will start DEXA at age 83. Nonsmoker

## 2013-10-02 DIAGNOSIS — R053 Chronic cough: Secondary | ICD-10-CM | POA: Insufficient documentation

## 2013-10-02 DIAGNOSIS — K219 Gastro-esophageal reflux disease without esophagitis: Secondary | ICD-10-CM | POA: Insufficient documentation

## 2013-11-18 ENCOUNTER — Emergency Department: Payer: Self-pay | Admitting: Emergency Medicine

## 2015-03-26 ENCOUNTER — Encounter: Payer: Self-pay | Admitting: Podiatry

## 2015-04-02 ENCOUNTER — Ambulatory Visit (INDEPENDENT_AMBULATORY_CARE_PROVIDER_SITE_OTHER): Payer: 59 | Admitting: Podiatry

## 2015-04-02 ENCOUNTER — Encounter: Payer: Self-pay | Admitting: Podiatry

## 2015-04-02 ENCOUNTER — Ambulatory Visit (INDEPENDENT_AMBULATORY_CARE_PROVIDER_SITE_OTHER): Payer: 59

## 2015-04-02 VITALS — BP 147/93 | HR 86 | Resp 16 | Ht 66.0 in | Wt 195.0 lb

## 2015-04-02 DIAGNOSIS — M719 Bursopathy, unspecified: Secondary | ICD-10-CM | POA: Diagnosis not present

## 2015-04-02 DIAGNOSIS — S92301A Fracture of unspecified metatarsal bone(s), right foot, initial encounter for closed fracture: Secondary | ICD-10-CM

## 2015-04-02 DIAGNOSIS — M7671 Peroneal tendinitis, right leg: Secondary | ICD-10-CM | POA: Diagnosis not present

## 2015-04-02 MED ORDER — METHYLPREDNISOLONE 4 MG PO TBPK: ORAL_TABLET | ORAL | Status: DC

## 2015-04-02 MED ORDER — MELOXICAM 15 MG PO TABS: 15.0000 mg | ORAL_TABLET | Freq: Every day | ORAL | Status: DC

## 2015-04-02 NOTE — Progress Notes (Signed)
   Subjective:    Patient ID: [name], female    DOB: 07/07/1956, 59 y.o.   MRN: [id number]  HPI Right foot lateral side very sore to touch and every once in a while i get a sharp shooting pain , it has been going on for a couple of months now   Review of Systems  All other systems reviewed and are negative.      Objective:   Physical Exam: I have reviewed her past medical history medications allergy surgery social history and review of systems. I have also reviewed her complaint. Pulses are strongly palpable bilateral. Neurologic sensorium is intact percent was the monofilament. Deep tendon reflexes intact bilateral muscle strength was 5 over 5 dorsiflexion plantar flexors and inverters and everters all intrinsic musculature is intact. She has pain on eversion against resistance along the fifth metatarsal base of the right foot. Orthopedic evaluation and x-rays all joints distal to the ankle full range of motion without crepitation. She does have pain on palpation of the proximal most portion of the fifth met base right. There is mild overlying erythema and edema with tenderness on palpation of the peroneal S brevis tendon as it inserts on the fifth metatarsal base right. Cutaneous evaluation demonstrates supple well-hydrated cutis no erythema edema saline drainage or odor other than after aforementioned. Radiographic evaluation does not demonstrate any type of osseus abnormalities in this area.        Assessment & Plan:  Assessment: Insertional peroneal brevis tendinitis right fifth metatarsal base.  Plan: Discussed etiology pathology conservative versus surgical therapies. Injected a small amount of dexamethasone, 2 mg to the point of maximal tenderness at the insertion site of the peroneal brevis tendon fifth met base right. Placed her in a Cam Walker. Started her on a Medrol Dosepak to be followed by meloxicam. I will follow up with her when she returns from [place].

## 2016-05-12 ENCOUNTER — Ambulatory Visit: Payer: 59 | Admitting: Podiatry

## 2016-05-18 ENCOUNTER — Encounter: Payer: Self-pay | Admitting: Podiatry

## 2016-05-18 ENCOUNTER — Ambulatory Visit (INDEPENDENT_AMBULATORY_CARE_PROVIDER_SITE_OTHER): Payer: 59

## 2016-05-18 ENCOUNTER — Ambulatory Visit (INDEPENDENT_AMBULATORY_CARE_PROVIDER_SITE_OTHER): Payer: 59 | Admitting: Podiatry

## 2016-05-18 VITALS — BP 103/60 | HR 86 | Resp 12

## 2016-05-18 DIAGNOSIS — M79672 Pain in left foot: Secondary | ICD-10-CM

## 2016-05-18 DIAGNOSIS — M7672 Peroneal tendinitis, left leg: Secondary | ICD-10-CM | POA: Diagnosis not present

## 2016-05-18 NOTE — Progress Notes (Signed)
   Subjective:    Patient ID: [name], female    DOB: May 28, 1956, 60 y.o.   MRN: [id number]  HPI: She presents today with a one-month duration of swelling to her left ankle and pain. She states that it hurts particularly running. She points to the posterior lateral malleolar region of her left ankle. She states it bothers her particularly when she makes certain motions with the foot. She denies any trauma. She's done nothing to try to help it.    Review of Systems  Cardiovascular: Positive for leg swelling.       Objective:   Physical Exam: Vital signs are stable she is alert and oriented 3. Pulses are palpable. Neurologic sensorium is intact. Deep tendon reflexes are intact. Muscle strength +5 over 5 dorsiflexion plantar flexors and inverters everters all intrinsic musculature is intact. Orthopedic evaluation and strength all joints distal to the ankle range of motion without crepitation. She has pain on palpation and plantarflexion with eversion along the erroneous longus tendon adjacent to the posterior aspect of the fibula just superior to the lateral malleolus. No broken skin or infection.        Assessment & Plan:  Perineal longus tendon tendinitis left.  Plan: Placed her in a cam boot and injected directly into the painful area today with 2 mg of dexamethasone and local anesthetic. I will follow-up with her in 1 month at which time we will assess as to whether or not an MRI needs to be performed or a physical therapy prescription.

## 2016-06-16 ENCOUNTER — Ambulatory Visit (INDEPENDENT_AMBULATORY_CARE_PROVIDER_SITE_OTHER): Payer: 59 | Admitting: Podiatry

## 2016-06-16 ENCOUNTER — Encounter: Payer: Self-pay | Admitting: Podiatry

## 2016-06-16 ENCOUNTER — Telehealth: Payer: Self-pay | Admitting: *Deleted

## 2016-06-16 DIAGNOSIS — M7672 Peroneal tendinitis, left leg: Secondary | ICD-10-CM | POA: Diagnosis not present

## 2016-06-16 NOTE — Progress Notes (Signed)
She presents today for pain to the lateral aspect of the left ankle. She states that she has been wearing her cam walker for the past 3-4 weeks. She states that it may be doing some better but is still swells and is still painful.  Objective: Vital signs are stable she is alert and oriented 3. Pulses are palpable. Neurologic sensorium is intact. Deep tendon reflexes are intact. Muscle strength is normal bilateral. Orthopedic evaluation shows all joints distal to the ankle for range of motion without crepitation. She has edema and pain overlying the peroneal tendons of the left ankle.  Assessment: Split tear of the peroneal tendons left foot.  Plan: Requesting an MRI of the left foot and ankle. And I also dispensed a Tri-Lock brace. I will follow up with her once we have received the report from the MRI.

## 2016-06-16 NOTE — Telephone Encounter (Addendum)
[employer] DOES NOT CURRENTLY REQUIRE PRIOR AUTHORIZATION FOR [address] LEFT AT THIS TIME.  Faxed to [facility]. 06/25/2016-DrMilinda Pointer reviewed MRI and requested pt make an appt to discuss.  Informed pt of Dr. Stephenie Acres orders and transferred to schedulers. 06/29/2016-Pt states she will be out of town about another week and would like the MRI results. Has an appt 07/07/2016.

## 2016-06-23 ENCOUNTER — Ambulatory Visit
Admission: RE | Admit: 2016-06-23 | Discharge: 2016-06-23 | Disposition: A | Discharge status: Home / Self Care | Payer: 59 | Source: Ambulatory Visit | Attending: Podiatry | Admitting: Podiatry

## 2016-06-23 DIAGNOSIS — S93692A Other sprain of left foot, initial encounter: Secondary | ICD-10-CM | POA: Diagnosis not present

## 2016-06-23 DIAGNOSIS — M659 Synovitis and tenosynovitis, unspecified: Secondary | ICD-10-CM | POA: Insufficient documentation

## 2016-06-23 DIAGNOSIS — M7672 Peroneal tendinitis, left leg: Secondary | ICD-10-CM | POA: Insufficient documentation

## 2016-06-23 DIAGNOSIS — X58XXXA Exposure to other specified factors, initial encounter: Secondary | ICD-10-CM | POA: Diagnosis not present

## 2016-07-07 ENCOUNTER — Encounter: Payer: Self-pay | Admitting: Podiatry

## 2016-07-07 ENCOUNTER — Ambulatory Visit (INDEPENDENT_AMBULATORY_CARE_PROVIDER_SITE_OTHER): Payer: 59 | Admitting: Podiatry

## 2016-07-07 DIAGNOSIS — M7672 Peroneal tendinitis, left leg: Secondary | ICD-10-CM

## 2016-07-07 NOTE — Progress Notes (Signed)
She presents today for a follow-up of her MRI report. She states that her ankle and foot is not feeling any better.  Objective: Vital signs are stable she is alert and oriented 3. Pulses are palpable. Neurologic sensorium is intact. She still has pain on palpation to the lateral malleolus and the peroneal brevis tendon. MRI states significant tear to the peroneal brevis.  Assessment: Chronic tear peroneal brevis left.  Plan: Discussed etiology pathology surgical intervention. At this point we consented her for a primary repair of the peroneal brevis tendon with possible need for a cast boot. She understands this is amenable to it. We did discuss the possible postop complications which may include and are not limited to postop pain bleeding swelling infection recurrence need for further surgery over correction under correction failure of surgery to alleviate all of her symptoms. She saw Dr. patient's consent form and I will follow-up with her as needed.

## 2016-07-08 ENCOUNTER — Telehealth: Payer: Self-pay | Admitting: *Deleted

## 2016-07-08 NOTE — Telephone Encounter (Signed)
"  I saw Dr. Milinda Pointer yesterday and he said that I do have to have surgery.  I would like to coordinate the date today.  I have travel plans that I'm going to have to cancel and rearrange.  So if I could speak to you today, that would be great."  I'm returning your call.  When would you like to schedule surgery?  "What's the soonest you can do it?"  It depends on what type of insurance you have.  "I have [place]."  We can get you scheduled for 07/23/2016.  "Is that the soonest?"  Yes, I have to pre-cert you surgery.  I probably will not get your paperwork until Tuesday of next week when Dr. Milinda Pointer will be in the office.  "Okay, August 25th is fine."  Someone from the surgical center will call you with the arrival time 1-2 days before surgery date.  You can go ahead and register with the surgical center.

## 2016-07-19 ENCOUNTER — Other Ambulatory Visit: Payer: Self-pay | Admitting: *Deleted

## 2016-07-19 DIAGNOSIS — M7672 Peroneal tendinitis, left leg: Secondary | ICD-10-CM

## 2016-07-22 ENCOUNTER — Other Ambulatory Visit: Payer: Self-pay | Admitting: Podiatry

## 2016-07-22 MED ORDER — PROMETHAZINE HCL 25 MG PO TABS: 25.0000 mg | ORAL_TABLET | Freq: Three times a day (TID) | ORAL | 0 refills | Status: DC | PRN

## 2016-07-22 MED ORDER — CEPHALEXIN 500 MG PO CAPS: 500.0000 mg | ORAL_CAPSULE | Freq: Three times a day (TID) | ORAL | 0 refills | Status: DC

## 2016-07-22 MED ORDER — OXYCODONE-ACETAMINOPHEN 10-325 MG PO TABS: ORAL_TABLET | ORAL | 0 refills | Status: DC

## 2016-07-23 ENCOUNTER — Encounter: Payer: Self-pay | Admitting: Podiatry

## 2016-07-23 DIAGNOSIS — M7672 Peroneal tendinitis, left leg: Secondary | ICD-10-CM | POA: Diagnosis not present

## 2016-07-26 ENCOUNTER — Telehealth: Payer: Self-pay | Admitting: *Deleted

## 2016-07-26 NOTE — Telephone Encounter (Addendum)
Post op courtesy call-Pt states she is doing great, not getting up for much other than going to the bathroom.  I asked pt about the cast and visible toes and she says the cast is fine not tight and the toes are the same temperature and color as the non-surgery foot.  I encouraged pt to call the main office phone if questions or medication needs. Pt states understanding and asked to be transferred to schedule to change the time of her 1st POV. 09/03/2016-Pt states she is in boot and 1 crutch, doing quick down and up with the surgery foot and has no pain, is that how it is suppose to be, because when she tried without the crutch she had sharp pain in arch and up the foot.  I told her the surgery foot was not use to the stretching movement her weight applied to the areas, so the quick down and up steps with the crutch was offloading to an extent allow the foot to gradually get use to weight bearing. Pt states understanding.

## 2016-07-27 NOTE — Progress Notes (Unsigned)
DOS 07/23/2016 Primary repair peroneal tendons left foot/ankle possible application of cast.

## 2016-07-28 ENCOUNTER — Ambulatory Visit (INDEPENDENT_AMBULATORY_CARE_PROVIDER_SITE_OTHER): Payer: 59 | Admitting: Podiatry

## 2016-07-28 ENCOUNTER — Encounter: Payer: Self-pay | Admitting: Podiatry

## 2016-07-28 VITALS — BP 125/67 | HR 80 | Temp 97.8°F

## 2016-07-28 DIAGNOSIS — M7672 Peroneal tendinitis, left leg: Secondary | ICD-10-CM

## 2016-07-28 DIAGNOSIS — Z9889 Other specified postprocedural states: Secondary | ICD-10-CM

## 2016-07-28 NOTE — Progress Notes (Signed)
She presents today for her first postop visit regarding her peroneal tendon repair of her left foot. She states that is doing great date of surgery 07/23/2016. She denies fever chills nausea vomiting muscle aches and pains states it really has not hurt at all. She has had no chest pain and no shortness of breath.  Objective: Signs are stable cast is intact and clean on the bottom. She presents today with her knee scooter. She has full sensation to her toes she is able to move her toes in the cast is loose anteriorly and does not appear to be rubbing. Radiographs do not demonstrate any type of infection.  Assessment: Well-healing surgical foot and ankle left.  Plan: Follow up with me in 1 week for cast change. Staples and/or sutures will be removed at that time.

## 2016-08-04 ENCOUNTER — Encounter: Payer: Self-pay | Admitting: Podiatry

## 2016-08-10 ENCOUNTER — Ambulatory Visit (INDEPENDENT_AMBULATORY_CARE_PROVIDER_SITE_OTHER): Payer: 59 | Admitting: Podiatry

## 2016-08-10 ENCOUNTER — Encounter: Payer: Self-pay | Admitting: Podiatry

## 2016-08-10 DIAGNOSIS — M7672 Peroneal tendinitis, left leg: Secondary | ICD-10-CM

## 2016-08-10 DIAGNOSIS — Z9889 Other specified postprocedural states: Secondary | ICD-10-CM

## 2016-08-10 NOTE — Progress Notes (Signed)
She presents today 2 weeks status post. A repair left ankle. She denies fever chills nausea vomiting muscle aches and pains. She denies shortness of breath Pain or chest pain.  Objective: She presents today utilizing a knee scooter with a cast and left lower extremity which appears to be clinically and normal the bottom. Once removed demonstrates dry sterile dressing intact with no bleeding. Staples are intact was removed margins remain well coapted. Minimal edema no cellulitis no drainage no odor. She has good range of motion without pain.  Assessment: Well-healing surgical peroneal tendon repair left.  Plan: We redressed the foot today with a dry sterile compressive dressing and reapplied another below knee cast. Follow up with her in 2 weeks for removal of the cast and application of her Cam Walker.

## 2016-08-24 ENCOUNTER — Ambulatory Visit (INDEPENDENT_AMBULATORY_CARE_PROVIDER_SITE_OTHER): Payer: 59

## 2016-08-24 ENCOUNTER — Ambulatory Visit (INDEPENDENT_AMBULATORY_CARE_PROVIDER_SITE_OTHER): Payer: 59 | Admitting: Podiatry

## 2016-08-24 ENCOUNTER — Encounter: Payer: Self-pay | Admitting: Podiatry

## 2016-08-24 VITALS — BP 116/65 | HR 72 | Resp 16

## 2016-08-24 DIAGNOSIS — M7672 Peroneal tendinitis, left leg: Secondary | ICD-10-CM | POA: Diagnosis not present

## 2016-08-24 DIAGNOSIS — Z9889 Other specified postprocedural states: Secondary | ICD-10-CM

## 2016-08-24 DIAGNOSIS — M79673 Pain in unspecified foot: Secondary | ICD-10-CM

## 2016-08-24 NOTE — Progress Notes (Signed)
She presents today 1 month status post. A repair left ankle. She denies fever chills nausea vomiting muscle aches and pains. She denies shortness of breath Pain or chest pain.  Objective: She presents today utilizing a knee scooter with a cast and left lower extremity which appears to be clinically and normal the bottom. Once removed, demonstrates dry sterile dressing intact with no bleeding. Incision site appears intact with no signs of dehiscence.  Minimal edema no cellulitis no drainage no odor. She has good range of motion without pain.  Assessment: Well-healing surgical peroneal tendon repair left.  Plan: We redressed the foot today with a dry sterile compressive dressing. Patient is able to wear CAM boot, however continue non-weight bearing. Follow up with Dr. Milinda Pointer in 1 week. Possibly to begin weight bearing upon next visit.

## 2016-09-01 ENCOUNTER — Ambulatory Visit (INDEPENDENT_AMBULATORY_CARE_PROVIDER_SITE_OTHER): Payer: 59 | Admitting: Podiatry

## 2016-09-01 ENCOUNTER — Encounter: Payer: Self-pay | Admitting: Podiatry

## 2016-09-01 DIAGNOSIS — M7672 Peroneal tendinitis, left leg: Secondary | ICD-10-CM

## 2016-09-01 DIAGNOSIS — Z9889 Other specified postprocedural states: Secondary | ICD-10-CM

## 2016-09-02 NOTE — Progress Notes (Signed)
She presents today for follow-up of her peroneal tendon repair date of surgery was 07/23/2016. She states the right foot feels fabulous she is very happy with the outcome of her surgery thus far. She was placed in a Cam Walker her last visit and has been utilizing her knee scooter since that time. She denies fever chills nausea vomiting muscle aches and pains denies shortness of breath or chest pain.  Objective: Vital signs are stable alert and oriented 3. Pulses are strongly palpable. Neurologic sensorium is intact. She has good range of motion of the foot without tenderness on palpation there is no edema no cellulitis no signs of infection noted with open wounds. Incision site is going on to heal quite nicely without complications.  Assessment: Well-healed peroneal tendon repair of the left foot.   plan: I placed her back in her compression anklet in her Cam Walker and recommended that she start walking on the foot with partial weightbearing to full weightbearing over the next couple weeks I'll follow-up with her at that time and hopefully be able to place her in a Tri-Lock brace.

## 2016-09-22 ENCOUNTER — Ambulatory Visit (INDEPENDENT_AMBULATORY_CARE_PROVIDER_SITE_OTHER): Payer: 59 | Admitting: Podiatry

## 2016-09-22 VITALS — BP 115/74 | HR 74 | Temp 98.3°F | Resp 16

## 2016-09-22 DIAGNOSIS — M7672 Peroneal tendinitis, left leg: Secondary | ICD-10-CM

## 2016-09-22 DIAGNOSIS — Z9889 Other specified postprocedural states: Secondary | ICD-10-CM

## 2016-09-22 NOTE — Progress Notes (Signed)
She presents today for follow-up of her peroneal tendinitis and peroneal tendon repair left foot. Date of surgery 25 2017. She states it is doing much better but still swelling on daily basis.  Objective: Vital signs are stable she is alert and oriented 3. Moderate edema to the left lower extremity with venous distention no calf pain is noted. She still has tenderness on palpation of the peroneal tendons left foot.  Assessment: Some residual pain on left ankle from. He'll tendon repair also there is some weakness present. And edema.  Plan: At this point I am requesting that she attend physical therapy just to get her strength and balance back and decrease the edema. I'll follow up with her 1 month

## 2016-10-25 ENCOUNTER — Ambulatory Visit: Payer: 59 | Admitting: Podiatry

## 2016-11-01 ENCOUNTER — Encounter: Payer: Self-pay | Admitting: Podiatry

## 2016-11-01 ENCOUNTER — Ambulatory Visit (INDEPENDENT_AMBULATORY_CARE_PROVIDER_SITE_OTHER): Payer: 59 | Admitting: Podiatry

## 2016-11-01 DIAGNOSIS — M7672 Peroneal tendinitis, left leg: Secondary | ICD-10-CM | POA: Diagnosis not present

## 2016-11-01 DIAGNOSIS — Z9889 Other specified postprocedural states: Secondary | ICD-10-CM

## 2016-11-01 MED ORDER — METHYLPREDNISOLONE 4 MG PO TBPK: ORAL_TABLET | ORAL | 0 refills | Status: DC

## 2016-11-01 NOTE — Progress Notes (Signed)
She presents today complaining of pain to the lateral and dorsolateral aspect of the left foot. States that physical therapy has really helped her with her peroneal tendon repair left. She states that is doing very well that she has noticed some tenderness lateral aspect of the foot extending through the middle part of the foot.  Objective: Vital signs are stable alert and oriented 3. Pulses are palpable much decrease in edema surgical sites on the heel very well. She has tenderness on palpation of the fifth metatarsal base and of the third and fourth fifth met cuboid articulation.  Assessment: Metatarsalgia and capsulitis midfoot left. Well-healing surgical site left.  Plan: Start her on a Medrol Dosepak today and will follow up with her in 1 month

## 2016-12-01 ENCOUNTER — Ambulatory Visit: Payer: 59 | Admitting: Podiatry

## 2017-06-29 DIAGNOSIS — Z803 Family history of malignant neoplasm of breast: Secondary | ICD-10-CM | POA: Insufficient documentation

## 2017-06-29 DIAGNOSIS — L502 Urticaria due to cold and heat: Secondary | ICD-10-CM | POA: Insufficient documentation

## 2017-06-29 DIAGNOSIS — M75101 Unspecified rotator cuff tear or rupture of right shoulder, not specified as traumatic: Secondary | ICD-10-CM | POA: Insufficient documentation

## 2017-09-05 ENCOUNTER — Ambulatory Visit (INDEPENDENT_AMBULATORY_CARE_PROVIDER_SITE_OTHER): Payer: 59 | Admitting: Podiatry

## 2017-09-05 ENCOUNTER — Encounter: Payer: Self-pay | Admitting: Podiatry

## 2017-09-05 DIAGNOSIS — L600 Ingrowing nail: Secondary | ICD-10-CM | POA: Diagnosis not present

## 2017-09-05 MED ORDER — NEOMYCIN-POLYMYXIN-HC 1 % OT SOLN: OTIC | 1 refills | Status: DC

## 2017-09-05 NOTE — Patient Instructions (Signed)

## 2017-09-05 NOTE — Progress Notes (Signed)
She presents today with a chief complaint of a painful ingrown toenail hallux left. No change in her past medical history medications or allergies.  Objective: Vital signs are stable alert and oriented 3 pulses are palpable. Neurologic sensorium is intact. Degenerative flexors are intact. Muscle strength is normal symmetrical bilateral. Sharp incurvated nail margin along the tibial border hallux left.  Assessment: Ingrown toenail hallux left.  Plan: Chemical matrixectomy was performed today she tolerated the procedure well without complications after anesthesia was administered. We also provided her with both ulnar and home-going instructions and care and soaking of her toe as well as a prescription for Cortisporin Otic to be applied twice daily after soaking. Follow-up with me in 1-2 weeks.

## 2017-09-26 ENCOUNTER — Ambulatory Visit (INDEPENDENT_AMBULATORY_CARE_PROVIDER_SITE_OTHER): Payer: Self-pay | Admitting: Podiatry

## 2017-09-26 ENCOUNTER — Encounter: Payer: Self-pay | Admitting: Podiatry

## 2017-09-26 DIAGNOSIS — L600 Ingrowing nail: Secondary | ICD-10-CM

## 2017-09-26 NOTE — Patient Instructions (Signed)

## 2017-09-26 NOTE — Progress Notes (Signed)
She presents today for follow-up of her matrixectomy hallux left. She states that seems to be doing very good have not been soaking it lately and is feeling well.  Objective: Vital signs are stable she is alert and oriented 3. Pulses are palpable. There is no erythema no edema cellulitis drainage or odor. This appears to be healing uneventfully.  Assessment: Well-healing surgical toe hallux left status post matrixectomy tibial border.  Plan: Follow up with me on an as-needed basis. Instructed her on watching for signs and symptoms of infection notify me if there are any.

## 2018-01-31 ENCOUNTER — Ambulatory Visit (INDEPENDENT_AMBULATORY_CARE_PROVIDER_SITE_OTHER): Payer: 59 | Admitting: Podiatry

## 2018-01-31 ENCOUNTER — Other Ambulatory Visit: Payer: Self-pay | Admitting: Podiatry

## 2018-01-31 ENCOUNTER — Ambulatory Visit (INDEPENDENT_AMBULATORY_CARE_PROVIDER_SITE_OTHER): Payer: 59

## 2018-01-31 ENCOUNTER — Encounter: Payer: Self-pay | Admitting: Podiatry

## 2018-01-31 DIAGNOSIS — M659 Synovitis and tenosynovitis, unspecified: Secondary | ICD-10-CM

## 2018-01-31 DIAGNOSIS — M65071 Abscess of tendon sheath, right ankle and foot: Secondary | ICD-10-CM

## 2018-01-31 DIAGNOSIS — M779 Enthesopathy, unspecified: Secondary | ICD-10-CM | POA: Diagnosis not present

## 2018-02-02 NOTE — Progress Notes (Signed)
   Subjective:  62 year old female presenting today with a chief complaint of burning, stinging pain to the lateral right ankle that began about 4 weeks ago. She reports associated swelling of the area. Certain movements of the ankle increases the pain. She has not done anything to treat the symptoms. She denies any trauma or injury. Patient is here for further evaluation and treatment.   No past medical history on file.    Objective / Physical Exam:  General:  The patient is alert and oriented x3 in no acute distress. Dermatology:  Skin is warm, dry and supple bilateral lower extremities. Negative for open lesions or macerations. Vascular:  Palpable pedal pulses bilaterally. No edema or erythema noted. Capillary refill within normal limits. Neurological:  Epicritic and protective threshold grossly intact bilaterally.  Musculoskeletal Exam:  Pain on palpation to the lateral aspects of the patient's right ankle. Mild edema noted. Range of motion within normal limits to all pedal and ankle joints bilateral. Muscle strength 5/5 in all groups bilateral.   Radiographic Exam:  Normal osseous mineralization. Joint spaces preserved. No fracture/dislocation/boney destruction.    Assessment: #1 pain in right ankle #2 synovitis of lateral right ankle  Plan of Care:  #1 Patient was evaluated. #2 injection of 0.5 mL Celestone Soluspan injected in the patient's right ankle. 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 No NSAID prescribed.  #5 patient is to return to clinic in 4 weeks with Dr. Milinda Pointer.   Edrick Kins, DPM [facility]  Dr. Edrick Kins, [address]                                        [place], [place] [address]                Office [phone]  Fax [phone]

## 2018-03-01 ENCOUNTER — Encounter: Payer: Self-pay | Admitting: Podiatry

## 2018-03-01 ENCOUNTER — Ambulatory Visit: Payer: 59 | Admitting: Podiatry

## 2018-03-01 DIAGNOSIS — M7671 Peroneal tendinitis, right leg: Secondary | ICD-10-CM | POA: Diagnosis not present

## 2018-03-01 NOTE — Progress Notes (Signed)
She presents today for follow-up of her synovitis of her right ankle.  She states that the ankle part feels better but is still burning right back here she points to the inferior posterior aspect of the lateral malleolar region.  She relates she is approximately 60% improved.  Objective: Vital signs are stable she is alert and oriented x3.  She has fluctuance within the peroneal tendon sheath of the right peroneal is longus and brevis.  When this is palpated she is a burning sensation along the sural nerve radiating up the back of the leg.  Assessment: Peroneal tendinitis right foot.  Plan: I injected the area today with 20 mg Kenalog 5 mg Marcaine and recommended that she continue her ankle brace.  I want to follow-up with her in 1 month she will call sooner if needed.

## 2018-03-29 ENCOUNTER — Ambulatory Visit: Payer: 59 | Admitting: Podiatry

## 2018-05-17 ENCOUNTER — Encounter: Payer: Self-pay | Admitting: Podiatry

## 2018-05-17 ENCOUNTER — Ambulatory Visit: Payer: 59 | Admitting: Podiatry

## 2018-05-17 ENCOUNTER — Ambulatory Visit (INDEPENDENT_AMBULATORY_CARE_PROVIDER_SITE_OTHER): Payer: 59

## 2018-05-17 DIAGNOSIS — S9031XA Contusion of right foot, initial encounter: Secondary | ICD-10-CM

## 2018-05-17 DIAGNOSIS — M84374A Stress fracture, right foot, initial encounter for fracture: Secondary | ICD-10-CM | POA: Diagnosis not present

## 2018-05-17 NOTE — Progress Notes (Signed)
She presents today with a chief complaint of pain and swelling redness to the dorsum of her right foot x1 week.  She states that she went for a long walk and hiking one day last week and afterwards this developed.  She states that she does not remember injuring it.  Objective: Vital signs are stable she is alert and oriented x3.  Pulses are palpable.  Neurologic sensorium is intact.  Edema to the dorsal aspect of the foot with mild erythema overlying the second metatarsal distal aspect.  Radiographs taken today demonstrates what appears to be stress fracture at the neck of the second metatarsal is a little too early to tell yet for sure but that is what it appears to be and on with pain with palpation in that area is very suspicious of that so at this point we are going to call in a stress fracture.  Assessment: Probable stress fracture neck of the second metatarsal right.  Plan: Discussed etiology pathology conservative surgical therapies at this point and will her to put her foot in a compression anklet that showed he has at home and utilize a Darco shoe that she already has at home she is to continue to walk on this for at least 4 weeks and not be barefoot.  I will follow-up with her in 4 weeks for set of x-rays she will call sooner if needed.  We discussed pain medication which I instructed her to take 600 mg of ibuprofen with an extra strength Tylenol 3 times a day.  She will follow-up with me in 1 month

## 2018-06-14 ENCOUNTER — Ambulatory Visit: Payer: 59

## 2018-06-14 ENCOUNTER — Encounter: Payer: Self-pay | Admitting: Podiatry

## 2018-06-14 ENCOUNTER — Ambulatory Visit: Payer: 59 | Admitting: Podiatry

## 2018-06-14 DIAGNOSIS — M84374A Stress fracture, right foot, initial encounter for fracture: Secondary | ICD-10-CM

## 2018-06-14 DIAGNOSIS — M84374D Stress fracture, right foot, subsequent encounter for fracture with routine healing: Secondary | ICD-10-CM | POA: Diagnosis not present

## 2018-06-14 NOTE — Progress Notes (Signed)
She presents today for follow-up of her stress fracture second metatarsal she states that he feels a lot better than it did and she is been wearing her Darco shoe.  Objective: Vital signs are stable she is alert and oriented x3.  There is no erythema edema no cellulitis drainage or odor but tenderness overlying the second metatarsal mid diaphyseal region.  Radiographs taken today do demonstrate a healing stress fracture minimal displacement of the bone no comminution.  Assessment: Slowly healing stress fracture second metatarsal.  Plan: Recommend that she wear her Darco shoe for at least another 2 weeks.

## 2018-10-31 ENCOUNTER — Ambulatory Visit (INDEPENDENT_AMBULATORY_CARE_PROVIDER_SITE_OTHER): Payer: 59

## 2018-10-31 ENCOUNTER — Ambulatory Visit: Payer: 59 | Admitting: Podiatry

## 2018-10-31 ENCOUNTER — Encounter: Payer: Self-pay | Admitting: Podiatry

## 2018-10-31 DIAGNOSIS — M778 Other enthesopathies, not elsewhere classified: Secondary | ICD-10-CM

## 2018-10-31 DIAGNOSIS — M779 Enthesopathy, unspecified: Secondary | ICD-10-CM | POA: Diagnosis not present

## 2018-10-31 NOTE — Progress Notes (Signed)
She presents today chief complaint of pain to the dorsal aspect of the left foot x1 week denies any injury.  Objective: Vital signs are stable she is alert and oriented x3.  Has tenderness on palpation of the base of the second metatarsal.  Radiographs do not demonstrate any fracture.  Assessment: Capsulitis hospital tendinitis.  Plan: At this point I injected a small amount of Kenalog after sterile Betadine skin prep a total of 10 mg was injected with 5 mg Marcaine.  Tolerated procedure well without comp occasions.  Instructed her to wear her cam walker for the next week.  Follow-up with me in 1 month

## 2018-12-04 ENCOUNTER — Encounter: Payer: Self-pay | Admitting: Podiatry

## 2018-12-04 ENCOUNTER — Ambulatory Visit (INDEPENDENT_AMBULATORY_CARE_PROVIDER_SITE_OTHER): Payer: BLUE CROSS/BLUE SHIELD | Admitting: Podiatry

## 2018-12-04 DIAGNOSIS — S93602A Unspecified sprain of left foot, initial encounter: Secondary | ICD-10-CM | POA: Diagnosis not present

## 2018-12-04 NOTE — Progress Notes (Signed)
She presents today for follow-up of capsulitis to the dorsal aspect of the left foot.  She states is doing much better but is not 100% yet.  Objective: He still has pain on palpation and range of motion at the base of the third metatarsal of her left foot.  More than likely this was a sprain.  Assessment: Resolving sprain dorsal aspect left foot.  Plan: Follow-up with [place] on an as-needed basis.

## 2018-12-07 DIAGNOSIS — L570 Actinic keratosis: Secondary | ICD-10-CM

## 2018-12-07 HISTORY — DX: Actinic keratosis: L57.0

## 2019-05-14 ENCOUNTER — Other Ambulatory Visit: Payer: Self-pay | Admitting: Physical Medicine and Rehabilitation

## 2019-05-14 ENCOUNTER — Other Ambulatory Visit (HOSPITAL_COMMUNITY): Payer: Self-pay | Admitting: Physical Medicine and Rehabilitation

## 2019-05-14 DIAGNOSIS — M5416 Radiculopathy, lumbar region: Secondary | ICD-10-CM

## 2019-05-27 ENCOUNTER — Ambulatory Visit: Payer: 59

## 2019-08-27 ENCOUNTER — Other Ambulatory Visit: Payer: Self-pay | Admitting: Physician Assistant

## 2019-08-27 DIAGNOSIS — M25561 Pain in right knee: Secondary | ICD-10-CM

## 2019-09-03 ENCOUNTER — Ambulatory Visit: Payer: BC Managed Care – PPO

## 2019-09-16 DIAGNOSIS — M545 Low back pain, unspecified: Secondary | ICD-10-CM | POA: Insufficient documentation

## 2019-09-16 DIAGNOSIS — G8929 Other chronic pain: Secondary | ICD-10-CM | POA: Insufficient documentation

## 2019-09-30 NOTE — Discharge Instructions (Signed)
°  Instructions after Knee Arthroscopy    [name], Jr., M.D.     Dept. of Orthopaedics & Sports Medicine  [facility]  [address]  [place], [place]  [address]   Phone: [phone]   Fax: [phone]   DIET: Drink plenty of non-alcoholic fluids & begin a light diet. Resume your normal diet the day after surgery.  ACTIVITY:  You may use crutches or a walker with weight-bearing as tolerated, unless instructed otherwise. You may wean yourself off of the walker or crutches as tolerated.  Begin doing gentle exercises. Exercising will reduce the pain and swelling, increase motion, and prevent muscle weakness.   Avoid strenuous activities or athletics for a minimum of 4-6 weeks after arthroscopic surgery. Do not drive or operate any equipment until instructed.  WOUND CARE:  Place one to two pillows under the knee the first day or two when sitting or lying.  Continue to use the ice packs periodically to reduce pain and swelling. The small incisions in your knee are closed with nylon stitches. The stitches will be removed in the office. The bulky dressing may be removed on the second day after surgery. DO NOT TOUCH THE STITCHES. Put a Band-Aid over each stitch. Do NOT use any ointments or creams on the incisions.  You may bathe or shower after the stitches are removed at the first office visit following surgery.  MEDICATIONS: You may resume your regular medications. Please take the pain medication as prescribed. Do not take pain medication on an empty stomach. Do not drive or drink alcoholic beverages when taking pain medications.  CALL THE OFFICE FOR: Temperature above 101 degrees Excessive bleeding or drainage on the dressing. Excessive swelling, coldness, or paleness of the toes. Persistent nausea and vomiting.  FOLLOW-UP:  You should have an appointment to return to the office in 7-10 days after surgery.     [facility] Department Directory          [email or web address]       [email or web address]          Cardiology  Appointments: [place] - [phone] [place] - [phone]  Endocrinology  Appointments: [place] - [phone] [place] - [phone]  Gastroenterology  Appointments: [place] - [phone] [place] - [phone]        General Surgery   Appointments: [place] - [phone]  Internal Medicine/Family Medicine  Appointments: [place] - [phone] [place] - [phone] [place] - [phone]  Metabolic and Weigh Loss Surgery  Appointments: [place] - [phone]        Neurology  Appointments: [place] - [phone] [place] - [phone]  Neurosurgery  Appointments: [place] - [phone]  Obstetrics & Gynecology  Appointments: [place] - [phone] [place] - [phone]        Pediatrics  Appointments: [place] - [phone] [place] - [phone]  Physiatry  Appointments: [place] -[phone]  Physical Therapy  Appointments: [place] - [phone] [place] - [phone]        Podiatry  Appointments: [place] - [phone] [place] - [phone]  Pulmonology  Appointments: [place] - [phone]  Rheumatology  Appointments: [place] - [phone]        [place] Location: [facility]  [address] , [place]  [address]  [place] Location: [facility] [address] [place], [place]  [address]  [place] Location: [facility] [address] [place], [place]  [address]    

## 2019-09-30 NOTE — H&P (Signed)
ORTHOPAEDIC HISTORY & PHYSICAL  Signed . Encounter Date: 09/13/2019 . Tocara Mennen, Florinda Marker., MD  Chief Complaint:     Chief Complaint  Patient presents with  . Knee Pain    Right knee meniscus tear, MRI 08/28/19    Reason for Visit: The patient is a 63 y.o. female who presents today for reevaluation of her right knee. She reports a 1-1/2 year(s) history of right knee pain.  She does not recall any specific trauma or aggravating event. She localizes most of the pain along the medial aspect of the knee. She reports significant swelling, no locking, and some giving way of the knee. The pain is aggravated by deep flexion, lateral movements, pivoting and squatting. The patient has not appreciated any significant improvement despite NSAIDs, intra-articular corticosteroid injections, and viscosupplementation.   Medications:       Current Outpatient Medications  Medication Sig Dispense Refill  . frovatriptan (FROVA) 2.5 MG tablet Take by mouth once as needed On set migraine      . glucosamine/chondr su A sod (OSTEO BI-FLEX ORAL) Take 2 capsules by mouth 2 (two) times daily Pt takes 2 capsules in am and 1 capsule at bedtime    . omeprazole (PRILOSEC) 20 MG DR capsule Take 20 mg by mouth once daily     No current facility-administered medications for this visit.     Allergies:      Allergies  Allergen Reactions  . Meloxicam Other (See Comments)    BLOOD IN URINE with large dose    Past Medical History:     Past Medical History:  Diagnosis Date  . Chicken pox   . Migraines   . Reflux esophagitis     Past Surgical History:      Past Surgical History:  Procedure Laterality Date  . APPENDECTOMY    . eptopic preg    . HYSTERECTOMY VAGINAL    . TONSILLECTOMY      Social History: Social History        Socioeconomic History  . Marital status: Widowed    Spouse name: Not on file  . Number of children: 1  . Years of education: 64  .  Highest education level: High school graduate  Occupational History  . 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 Financial resource strain: Not on file  . Food insecurity    Worry: Not on file    Inability: Not on file  . Transportation needs    Medical: Not on file    Non-medical: Not on file  Tobacco Use  . Smoking status: Never Smoker  . Smokeless tobacco: Never Used  Substance and Sexual Activity  . Alcohol use: Never    Frequency: Never  . Drug use: Never  . Sexual activity: Defer    Partners: Male  Lifestyle  . Physical activity    Days per week: Not on file    Minutes per session: Not on file  . Stress: Not on file  Relationships  . Social Herbalist on phone: Not on file    Gets together: Not on file    Attends religious service: Not on file    Active member of club or organization: Not on file    Attends meetings of clubs or organizations: Not on file    Relationship status: Not on file  Other Topics Concern  . Not on file  Social History Narrative  . Not on file    Family History:  Family History  Problem Relation Age of Onset  . Stroke Mother   . Melanoma Father   . Breast cancer Sister     Review of Systems: A comprehensive 14 point ROS was performed, reviewed, and the pertinent orthopaedic findings are documented in the HPI.  Exam BP 130/74   Temp 36.3 C (97.3 F) (Skin)   Ht 170.2 cm (5\' 7" )   Wt (!) 105 kg (231 lb 6.4 oz)   BMI 36.24 kg/m   General:  Well-developed, well-nourished female seen in no acute distress.  Antalgic gait.  No varus or valgus thrust to the right knee.  HEENT:  Atraumatic, normocephalic.  Pupils are equal and reactive to light.  Extraocular motion is intact. Sclera are clear.  Oropharynx is clear with moist mucosa.  Lungs:  Clear to auscultation bilaterally.  Cardiovascular: Regular rate and rhythm.  Normal S1, S2.  No murmur .  No  appreciable gallops or rubs. Peripheral pulses are palpable.  No lower extremity edema.  Homan`s test is negative.   Extremities: Good strength, stability, and range of motion of the upper extremities. Good range of motion of the hips and ankles.  Right Knee:        Soft tissue swelling: mild    Effusion:                   none    Erythema:                 none    Crepitance:               minimal    Tenderness:             medial    Alignment:                normal    Mediolateral laxity:   stable    Anterior drawer test:negative    Lachman`s test:       negative    McMurray`s test:      positive    Atrophy:                    No significant atrophy.                                       Quadriceps tone was fair to good.    Range of Motion:     Greater than 110 degrees  Neurologic:  Awake, alert, and oriented.  Sensory function is intact to pinprick and light touch.   Motor strength is judged to be 5/5.   Motor coordination is within normal limits.   No apparent clonus. No tremor.    MRI: I reviewed the right knee MRI from [facility] dated 08/28/2019.  ACL is intact.   PCL is intact.   Medial meniscus  demonstrates mucoid degeneration.  Lateral meniscus  demonstrates mucoid degeneration with findings suggestive of a tear of the posterior horn.  MCL and LCL appear normal.    There is a moderate-sized joint effusion with superior plica.  A popliteal cyst is noted with some surrounding fluid suggestive of a partial rupture. Cartilaginous thinning and defects are noted to the distal medial femoral condyle and medial tibial plateau with mild underlying marrow edema.  Smaller chondral defects are noted to involve the lateral tibial plateau with associated marrow edema.  Impression: Internal  derangement of the right knee  Plan:   The findings were discussed in detail with the patient. The patient was given informational material on knee  arthroscopy. Conservative treatment options were reviewed with the patient.  We discussed the risks and benefits of surgical intervention.  The usual perioperative course was also discussed in detail. Arthroscopy is an appropriate treatment for the meniscal pathology, but would have limited or no effect on degenerative changes of the articular cartilage. The patient expressed understanding of the risks and benefits of surgical intervention and would like to proceed with plans for right knee arthroscopy.  MEDICAL CLEARANCE: Per anesthesiology. ACTIVITIES:  Avoid pivoting, squatting, or twisting. WORK STATUS: Not applicable. THERAPY: Quadriceps strengthening exercises. MEDICATIONS: Requested Prescriptions    No prescriptions requested or ordered in this encounter   FOLLOW-UP: Return for preop History & Physical pending surgery date.     Cormac Wint P. Holley Bouche., M.D.  This note was generated in part with voice recognition software and I apologize for any typographical errors that were not detected and corrected.

## 2019-10-01 ENCOUNTER — Other Ambulatory Visit: Payer: BC Managed Care – PPO

## 2019-10-02 ENCOUNTER — Encounter
Admission: RE | Admit: 2019-10-02 | Discharge: 2019-10-02 | Disposition: A | Discharge status: Home / Self Care | Payer: BC Managed Care – PPO | Source: Ambulatory Visit | Attending: Orthopedic Surgery | Admitting: Orthopedic Surgery

## 2019-10-02 ENCOUNTER — Other Ambulatory Visit: Payer: Self-pay

## 2019-10-02 HISTORY — DX: Unspecified osteoarthritis, unspecified site: M19.90

## 2019-10-02 HISTORY — DX: Gastro-esophageal reflux disease without esophagitis: K21.9

## 2019-10-02 HISTORY — DX: Headache, unspecified: R51.9

## 2019-10-02 HISTORY — DX: Personal history of urinary calculi: Z87.442

## 2019-10-02 NOTE — Patient Instructions (Signed)
Your procedure is scheduled on: Mon 11/9 Report to Day Surgery. To find out your arrival time please call [phone] between 1PM - 3PM on Friday 11/6.  Remember: Instructions that are not followed completely may result in serious medical risk,  up to and including death, or upon the discretion of your surgeon and anesthesiologist your  surgery may need to be rescheduled.     _X__ 1. Do not eat food after midnight the night before your procedure.                 No gum chewing or hard candies. You may drink clear liquids up to 2 hours                 before you are scheduled to arrive for your surgery- DO not drink clear                 liquids within 2 hours of the start of your surgery.                 Clear Liquids include:  water, apple juice without pulp, clear carbohydrate                 drink such as Clearfast of Gatorade, Black Coffee or Tea (Do not add                 anything to coffee or tea).  __X__2.  On the morning of surgery brush your teeth with toothpaste and water, you                may rinse your mouth with mouthwash if you wish.  Do not swallow any toothpaste of mouthwash.     _X__ 3.  No Alcohol for 24 hours before or after surgery.   ___ 4.  Do Not Smoke or use e-cigarettes For 24 Hours Prior to Your Surgery.                 Do not use any chewable tobacco products for at least 6 hours prior to                 surgery.  ____  5.  Bring all medications with you on the day of surgery if instructed.   __x__  6.  Notify your doctor if there is any change in your medical condition      (cold, fever, infections).     Do not wear jewelry, make-up, hairpins, clips or nail polish. Do not wear lotions, powders, or perfumes. You may wear deodorant. Do not shave 48 hours prior to surgery. Men may shave face and neck. Do not bring valuables to the hospital.    [facility] is not responsible for any belongings or valuables.  Contacts, dentures  or bridgework may not be worn into surgery. Leave your suitcase in the car. After surgery it may be brought to your room. For patients admitted to the hospital, discharge time is determined by your treatment team.   Patients discharged the day of surgery will not be allowed to drive home.   Please read over the following fact sheets that you were given:    __x__ Take these medicines the morning of surgery with A SIP OF WATER:    1.loratadine (CLARITIN) 10 MG tablet  2. omeprazole (PRILOSEC OTC) 20 MG tablet  3.   4.  5.  6.  ____ Fleet Enema (as directed)   _x___ Use CHG Soap as directed  ____ Use inhalers on the day of surgery  ____ Stop metformin 2 days prior to surgery    ____ Take 1/2 of usual insulin dose the night before surgery. No insulin the morning          of surgery.   ____ Stop Coumadin/Plavix/aspirin on   ___x_ Stop Anti-inflammatories on    __x__ Stop supplements until after surgery.    ____ Bring C-Pap to the hospital.

## 2019-10-04 ENCOUNTER — Other Ambulatory Visit: Payer: Self-pay

## 2019-10-04 ENCOUNTER — Other Ambulatory Visit
Admission: RE | Admit: 2019-10-04 | Discharge: 2019-10-04 | Disposition: A | Discharge status: Home / Self Care | Payer: BC Managed Care – PPO | Source: Ambulatory Visit | Attending: Orthopedic Surgery | Admitting: Orthopedic Surgery

## 2019-10-04 DIAGNOSIS — Z20828 Contact with and (suspected) exposure to other viral communicable diseases: Secondary | ICD-10-CM | POA: Diagnosis not present

## 2019-10-04 DIAGNOSIS — Z01812 Encounter for preprocedural laboratory examination: Secondary | ICD-10-CM | POA: Insufficient documentation

## 2019-10-04 LAB — SARS CORONAVIRUS 2 (TAT 6-24 HRS): SARS Coronavirus 2: NEGATIVE

## 2019-10-07 ENCOUNTER — Encounter: Payer: Self-pay | Admitting: Orthopedic Surgery

## 2019-10-08 ENCOUNTER — Other Ambulatory Visit: Payer: Self-pay

## 2019-10-08 ENCOUNTER — Ambulatory Visit: Payer: BC Managed Care – PPO | Admitting: Certified Registered Nurse Anesthetist

## 2019-10-08 ENCOUNTER — Encounter
Admission: RE | Disposition: A | Discharge status: Home / Self Care | Payer: Self-pay | Source: Home / Self Care | Attending: Orthopedic Surgery

## 2019-10-08 ENCOUNTER — Encounter: Payer: Self-pay | Admitting: *Deleted

## 2019-10-08 ENCOUNTER — Ambulatory Visit
Admission: RE | Admit: 2019-10-08 | Discharge: 2019-10-08 | Disposition: A | Discharge status: Home / Self Care | Payer: BC Managed Care – PPO | Attending: Orthopedic Surgery | Admitting: Orthopedic Surgery

## 2019-10-08 DIAGNOSIS — Z79899 Other long term (current) drug therapy: Secondary | ICD-10-CM | POA: Diagnosis not present

## 2019-10-08 DIAGNOSIS — M23251 Derangement of posterior horn of lateral meniscus due to old tear or injury, right knee: Secondary | ICD-10-CM | POA: Insufficient documentation

## 2019-10-08 DIAGNOSIS — Z886 Allergy status to analgesic agent status: Secondary | ICD-10-CM | POA: Diagnosis not present

## 2019-10-08 DIAGNOSIS — K2100 Gastro-esophageal reflux disease with esophagitis, without bleeding: Secondary | ICD-10-CM | POA: Diagnosis not present

## 2019-10-08 DIAGNOSIS — M94261 Chondromalacia, right knee: Secondary | ICD-10-CM | POA: Insufficient documentation

## 2019-10-08 DIAGNOSIS — Z9889 Other specified postprocedural states: Secondary | ICD-10-CM

## 2019-10-08 DIAGNOSIS — G43909 Migraine, unspecified, not intractable, without status migrainosus: Secondary | ICD-10-CM | POA: Insufficient documentation

## 2019-10-08 DIAGNOSIS — M1990 Unspecified osteoarthritis, unspecified site: Secondary | ICD-10-CM | POA: Insufficient documentation

## 2019-10-08 DIAGNOSIS — M2391 Unspecified internal derangement of right knee: Secondary | ICD-10-CM | POA: Diagnosis present

## 2019-10-08 HISTORY — PX: KNEE ARTHROSCOPY: SHX127

## 2019-10-08 SURGERY — ARTHROSCOPY, KNEE
Anesthesia: General | Site: Knee | Laterality: Right

## 2019-10-08 MED ORDER — ONDANSETRON HCL 4 MG/2ML IJ SOLN
INTRAMUSCULAR | Status: DC | PRN
  Administered 2019-10-08: 4 mg via INTRAVENOUS

## 2019-10-08 MED ORDER — DEXAMETHASONE SODIUM PHOSPHATE 10 MG/ML IJ SOLN
INTRAMUSCULAR | Status: AC
  Filled 2019-10-08: qty 1

## 2019-10-08 MED ORDER — ONDANSETRON HCL 4 MG/2ML IJ SOLN
INTRAMUSCULAR | Status: AC
  Filled 2019-10-08: qty 2

## 2019-10-08 MED ORDER — LIDOCAINE HCL (PF) 2 % IJ SOLN
INTRAMUSCULAR | Status: AC
  Filled 2019-10-08: qty 10

## 2019-10-08 MED ORDER — FENTANYL CITRATE (PF) 100 MCG/2ML IJ SOLN
25.0000 ug | INTRAMUSCULAR | Status: DC | PRN
  Administered 2019-10-08 (×4): 25 ug via INTRAVENOUS

## 2019-10-08 MED ORDER — BUPIVACAINE HCL (PF) 0.25 % IJ SOLN
INTRAMUSCULAR | Status: AC
  Filled 2019-10-08: qty 30

## 2019-10-08 MED ORDER — MORPHINE SULFATE (PF) 4 MG/ML IV SOLN
INTRAVENOUS | Status: AC
  Filled 2019-10-08: qty 1

## 2019-10-08 MED ORDER — PROPOFOL 10 MG/ML IV BOLUS
INTRAVENOUS | Status: AC
  Filled 2019-10-08: qty 20

## 2019-10-08 MED ORDER — EPINEPHRINE PF 1 MG/ML IJ SOLN
INTRAMUSCULAR | Status: AC
  Filled 2019-10-08: qty 1

## 2019-10-08 MED ORDER — EPHEDRINE SULFATE 50 MG/ML IJ SOLN
INTRAMUSCULAR | Status: DC | PRN
  Administered 2019-10-08 (×3): 10 mg via INTRAVENOUS
  Administered 2019-10-08: 5 mg via INTRAVENOUS

## 2019-10-08 MED ORDER — GLYCOPYRROLATE 0.2 MG/ML IJ SOLN
INTRAMUSCULAR | Status: DC | PRN
  Administered 2019-10-08: 0.2 mg via INTRAVENOUS

## 2019-10-08 MED ORDER — FENTANYL CITRATE (PF) 100 MCG/2ML IJ SOLN
INTRAMUSCULAR | Status: AC
  Filled 2019-10-08: qty 2

## 2019-10-08 MED ORDER — CELECOXIB 200 MG PO CAPS
ORAL_CAPSULE | ORAL | Status: AC
  Filled 2019-10-08: qty 2

## 2019-10-08 MED ORDER — ACETAMINOPHEN 10 MG/ML IV SOLN
INTRAVENOUS | Status: AC
  Filled 2019-10-08: qty 100

## 2019-10-08 MED ORDER — MIDAZOLAM HCL 2 MG/2ML IJ SOLN
INTRAMUSCULAR | Status: DC | PRN
  Administered 2019-10-08: 2 mg via INTRAVENOUS

## 2019-10-08 MED ORDER — EPHEDRINE SULFATE 50 MG/ML IJ SOLN
INTRAMUSCULAR | Status: AC
  Filled 2019-10-08: qty 1

## 2019-10-08 MED ORDER — OXYCODONE HCL 5 MG PO TABS
ORAL_TABLET | ORAL | Status: AC
  Administered 2019-10-08: 5 mg via ORAL
  Filled 2019-10-08: qty 1

## 2019-10-08 MED ORDER — ONDANSETRON HCL 4 MG PO TABS: 4.0000 mg | ORAL_TABLET | Freq: Four times a day (QID) | ORAL | Status: DC | PRN

## 2019-10-08 MED ORDER — OXYCODONE HCL 5 MG/5ML PO SOLN: 5.0000 mg | Freq: Once | ORAL | Status: AC | PRN

## 2019-10-08 MED ORDER — OXYCODONE HCL 5 MG PO TABS
5.0000 mg | ORAL_TABLET | Freq: Once | ORAL | Status: AC | PRN
  Administered 2019-10-08: 18:00:00 5 mg via ORAL

## 2019-10-08 MED ORDER — SCOPOLAMINE 1 MG/3DAYS TD PT72
MEDICATED_PATCH | TRANSDERMAL | Status: AC
  Filled 2019-10-08: qty 1

## 2019-10-08 MED ORDER — BUPIVACAINE-EPINEPHRINE 0.25% -1:200000 IJ SOLN
INTRAMUSCULAR | Status: DC | PRN
  Administered 2019-10-08: 5 mL

## 2019-10-08 MED ORDER — PROPOFOL 10 MG/ML IV BOLUS
INTRAVENOUS | Status: DC | PRN
  Administered 2019-10-08: 150 mg via INTRAVENOUS
  Administered 2019-10-08: 50 mg via INTRAVENOUS

## 2019-10-08 MED ORDER — METOCLOPRAMIDE HCL 5 MG/ML IJ SOLN: 5.0000 mg | Freq: Three times a day (TID) | INTRAMUSCULAR | Status: DC | PRN

## 2019-10-08 MED ORDER — HYDROCODONE-ACETAMINOPHEN 5-325 MG PO TABS: 1.0000 | ORAL_TABLET | ORAL | 0 refills | Status: DC | PRN

## 2019-10-08 MED ORDER — MIDAZOLAM HCL 2 MG/2ML IJ SOLN
INTRAMUSCULAR | Status: AC
  Filled 2019-10-08: qty 2

## 2019-10-08 MED ORDER — SCOPOLAMINE 1 MG/3DAYS TD PT72
MEDICATED_PATCH | TRANSDERMAL | Status: DC | PRN
  Administered 2019-10-08: 1 via TRANSDERMAL

## 2019-10-08 MED ORDER — CHLORHEXIDINE GLUCONATE 4 % EX LIQD
60.0000 mL | Freq: Once | CUTANEOUS | Status: AC
  Administered 2019-10-08: 4 via TOPICAL

## 2019-10-08 MED ORDER — DEXAMETHASONE SODIUM PHOSPHATE 10 MG/ML IJ SOLN
INTRAMUSCULAR | Status: DC | PRN
  Administered 2019-10-08: 10 mg via INTRAVENOUS

## 2019-10-08 MED ORDER — LIDOCAINE HCL (CARDIAC) PF 100 MG/5ML IV SOSY
PREFILLED_SYRINGE | INTRAVENOUS | Status: DC | PRN
  Administered 2019-10-08: 50 mg via INTRAVENOUS

## 2019-10-08 MED ORDER — PHENYLEPHRINE HCL (PRESSORS) 10 MG/ML IV SOLN
INTRAVENOUS | Status: DC | PRN
  Administered 2019-10-08: 50 ug via INTRAVENOUS
  Administered 2019-10-08: 100 ug via INTRAVENOUS
  Administered 2019-10-08: 50 ug via INTRAVENOUS

## 2019-10-08 MED ORDER — LACTATED RINGERS IV SOLN
INTRAVENOUS | Status: DC
  Administered 2019-10-08: 15:00:00 via INTRAVENOUS

## 2019-10-08 MED ORDER — FENTANYL CITRATE (PF) 100 MCG/2ML IJ SOLN
INTRAMUSCULAR | Status: AC
  Administered 2019-10-08: 25 ug via INTRAVENOUS
  Filled 2019-10-08: qty 2

## 2019-10-08 MED ORDER — FENTANYL CITRATE (PF) 100 MCG/2ML IJ SOLN
INTRAMUSCULAR | Status: DC | PRN
  Administered 2019-10-08 (×3): 25 ug via INTRAVENOUS
  Administered 2019-10-08: 50 ug via INTRAVENOUS

## 2019-10-08 MED ORDER — SODIUM CHLORIDE 0.9 % IV SOLN: INTRAVENOUS | Status: DC

## 2019-10-08 MED ORDER — ONDANSETRON HCL 4 MG/2ML IJ SOLN: 4.0000 mg | Freq: Four times a day (QID) | INTRAMUSCULAR | Status: DC | PRN

## 2019-10-08 MED ORDER — CELECOXIB 200 MG PO CAPS
400.0000 mg | ORAL_CAPSULE | Freq: Once | ORAL | Status: AC
  Administered 2019-10-08: 400 mg via ORAL

## 2019-10-08 MED ORDER — METOCLOPRAMIDE HCL 10 MG PO TABS: 5.0000 mg | ORAL_TABLET | Freq: Three times a day (TID) | ORAL | Status: DC | PRN

## 2019-10-08 MED ORDER — ACETAMINOPHEN 10 MG/ML IV SOLN
INTRAVENOUS | Status: DC | PRN
  Administered 2019-10-08: 1000 mg via INTRAVENOUS

## 2019-10-08 SURGICAL SUPPLY — 26 items
BLADE SHAVER 4.5 DBL SERAT CV (CUTTER) ×1 IMPLANT
COVER WAND RF STERILE (DRAPES) ×2 IMPLANT
CUFF TOURN SGL QUICK 30 (TOURNIQUET CUFF) ×1
CUFF TRNQT CYL 30X4X21-28X (TOURNIQUET CUFF) IMPLANT
DRSG DERMACEA 8X12 NADH (GAUZE/BANDAGES/DRESSINGS) ×2 IMPLANT
DURAPREP 26ML APPLICATOR (WOUND CARE) ×4 IMPLANT
GAUZE SPONGE 4X4 12PLY STRL (GAUZE/BANDAGES/DRESSINGS) ×2 IMPLANT
GLOVE BIOGEL M STRL SZ7.5 (GLOVE) ×2 IMPLANT
GLOVE INDICATOR 8.0 STRL GRN (GLOVE) ×2 IMPLANT
GOWN STRL REUS W/ TWL LRG LVL3 (GOWN DISPOSABLE) ×2 IMPLANT
GOWN STRL REUS W/TWL LRG LVL3 (GOWN DISPOSABLE) ×2
IV LACTATED RINGER IRRG 3000ML (IV SOLUTION) ×8
IV LR IRRIG 3000ML ARTHROMATIC (IV SOLUTION) ×6 IMPLANT
KIT TURNOVER KIT A (KITS) ×2 IMPLANT
MANIFOLD NEPTUNE II (INSTRUMENTS) ×2 IMPLANT
PACK ARTHROSCOPY KNEE (MISCELLANEOUS) ×2 IMPLANT
SET TUBE SUCT SHAVER OUTFL 24K (TUBING) ×2 IMPLANT
SET TUBE TIP INTRA-ARTICULAR (MISCELLANEOUS) ×2 IMPLANT
SOL PREP PVP 2OZ (MISCELLANEOUS) ×2
SOLUTION PREP PVP 2OZ (MISCELLANEOUS) ×1 IMPLANT
STOCKINETTE BIAS CUT 6 980064 (GAUZE/BANDAGES/DRESSINGS) ×1 IMPLANT
SUT ETHILON 3-0 FS-10 30 BLK (SUTURE) ×2
SUTURE EHLN 3-0 FS-10 30 BLK (SUTURE) ×1 IMPLANT
TUBING ARTHRO INFLOW-ONLY STRL (TUBING) ×2 IMPLANT
WAND HAND CNTRL MULTIVAC 50 (MISCELLANEOUS) ×2 IMPLANT
WRAP KNEE W/COLD PACKS 25.5X14 (SOFTGOODS) ×2 IMPLANT

## 2019-10-08 NOTE — H&P (Signed)
The patient has been re-examined, and the chart reviewed, and there have been no interval changes to the documented history and physical.    The risks, benefits, and alternatives have been discussed at length. The patient expressed understanding of the risks benefits and agreed with plans for surgical intervention.  James P. Hooten, Jr. M.D.    

## 2019-10-08 NOTE — Anesthesia Post-op Follow-up Note (Signed)
Anesthesia QCDR form completed.        

## 2019-10-08 NOTE — Op Note (Signed)
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   ASSISTANT: none  ANESTHESIA: general  ESTIMATED BLOOD LOSS: Minimal  FLUIDS REPLACED: 800 mL of crystalloid  TOURNIQUET TIME: Not used  INDICATIONS FOR SURGERY: [name] is a 63 y.o. year old female who has been seen for complaints of right knee pain. MRI demonstrated findings consistent with meniscal pathology. After discussion of the risks and benefits of surgical intervention, the patient expressed understanding of the risks benefits and agree with plans for right knee arthroscopy.   PROCEDURE IN DETAIL: The patient was brought into the operating room and, after adequate general anesthesia was achieved, a tourniquet was applied to the right thigh and the leg was placed in the leg holder. All bony prominences were well padded. The patient's right knee was cleaned and prepped with alcohol and Duraprep and draped in the usual sterile fashion. A "timeout" was performed as per usual protocol. The anticipated portal sites were injected with 0.25% Marcaine with epinephrine. An anterolateral incision was made and a cannula was inserted. A small effusion was evacuated and the knee was distended with fluid using the pump. The scope was advanced down the medial gutter into the medial compartment. Under visualization with the scope, an anteromedial portal was created and a hooked probe was inserted. The medial meniscus was visualized and probed.  The meniscus was intact.  The articular cartilage was visualized.  There were significant grade 3  changes of chondromalacia involving the medial femoral condyle and a localized area of grade IV chondromalacia involving the medial tibial plateau.  These areas were debrided and contoured using the 50 degree ArthroCare wand.  The scope was then advanced into the intercondylar notch. The anterior cruciate ligament was visualized and probed and felt to be intact. The scope was removed from the lateral portal and reinserted via the anteromedial portal to better visualize the lateral compartment. The lateral meniscus was visualized and probed.  There was a complex tear of the posterior horn of the lateral meniscus.  The tear was debrided using a 4.5 mm incisor shaver and then contoured using the 50 degree ArthroCare wand.  The remaining rim of meniscus was visualized and probed and felt to be stable. The articular cartilage of the lateral compartment was visualized and noted to be in good condition.  Finally, the scope was advanced so as to visualize the patellofemoral articulation. Good patellar tracking was appreciated.  Mild chondral changes were noted to the patellofemoral articulation.  The knee was irrigated with copius amounts of fluid and suctioned dry. The anterolateral portal was re-approximated with #3-0 nylon. A combination of 0.25% Marcaine with epinephrine and 4 mg of Morphine were injected via the scope. The scope was removed and the anteromedial portal was re-approximated with #3-0 nylon. A sterile dressing was applied followed by application of an ice wrap.  The patient tolerated the procedure well and was transported to the PACU in stable condition.  James P. Holley Bouche., M.D.

## 2019-10-08 NOTE — Transfer of Care (Signed)
Immediate Anesthesia Transfer of Care Note  Patient: [name]  Procedure(s) Performed: ARTHROSCOPY KNEE (Right Knee)  Patient Location: PACU  Anesthesia Type:General  Level of Consciousness: sedated  Airway & Oxygen Therapy: Patient Spontanous Breathing and Patient connected to face mask oxygen  Post-op Assessment: Report given to RN and Post -op Vital signs reviewed and stable  Post vital signs: Reviewed and stable  Last Vitals:  Vitals Value Taken Time  BP 125/72 10/08/19 1721  Temp    Pulse 80 10/08/19 1722  Resp 15 10/08/19 1722  SpO2 100 % 10/08/19 1722  Vitals shown include unvalidated device data.  Last Pain:  Vitals:   10/08/19 1439  TempSrc: Tympanic  PainSc: 0-No pain         Complications: No apparent anesthesia complications

## 2019-10-08 NOTE — Anesthesia Preprocedure Evaluation (Addendum)
Anesthesia Evaluation  Patient identified by MRN, date of birth, ID band Patient awake    Reviewed: Allergy & Precautions, H&P , NPO status , Patient's Chart, lab work & pertinent test results  History of Anesthesia Complications (+) PONV and history of anesthetic complications  Airway Mallampati: II  TM Distance: >3 FB Neck ROM: full    Dental  (+) Chipped   Pulmonary neg pulmonary ROS,           Cardiovascular negative cardio ROS       Neuro/Psych  Headaches, negative psych ROS   GI/Hepatic Neg liver ROS, GERD  Controlled and Medicated,  Endo/Other  negative endocrine ROS  Renal/GU      Musculoskeletal   Abdominal   Peds  Hematology negative hematology ROS (+)   Anesthesia Other Findings Past Medical History: No date: Arthritis No date: GERD (gastroesophageal reflux disease) No date: Headache No date: History of kidney stones  Past Surgical History: 2007: ABDOMINAL HYSTERECTOMY     Comment:  Partial, has both ovaries; for uterine prolapse No date: APPENDECTOMY 1988: LAPAROSCOPY FOR ECTOPIC PREGNANCY  BMI    Body Mass Index: 35.91 kg/m      Reproductive/Obstetrics negative OB ROS                             Anesthesia Physical Anesthesia Plan  ASA: II  Anesthesia Plan: General LMA   Post-op Pain Management:    Induction:   PONV Risk Score and Plan: 4 or greater and Dexamethasone, Ondansetron, Midazolam, Treatment may vary due to age or medical condition and Scopolamine patch - Pre-op  Airway Management Planned:   Additional Equipment:   Intra-op Plan:   Post-operative Plan:   Informed Consent: I have reviewed the patients History and Physical, chart, labs and discussed the procedure including the risks, benefits and alternatives for the proposed anesthesia with the patient or authorized representative who has indicated his/her understanding and acceptance.      Dental Advisory Given  Plan Discussed with: Anesthesiologist, CRNA and Surgeon  Anesthesia Plan Comments:        Anesthesia Quick Evaluation

## 2019-10-09 ENCOUNTER — Encounter: Payer: Self-pay | Admitting: Orthopedic Surgery

## 2019-10-15 NOTE — Anesthesia Postprocedure Evaluation (Signed)
Anesthesia Post Note  Patient: Unnamed Forsyth Bernhard  Procedure(s) Performed: ARTHROSCOPY KNEE (Right Knee)  Patient location during evaluation: PACU Anesthesia Type: General Level of consciousness: awake and alert Pain management: pain level controlled Vital Signs Assessment: post-procedure vital signs reviewed and stable Respiratory status: spontaneous breathing, nonlabored ventilation, respiratory function stable and patient connected to nasal cannula oxygen Cardiovascular status: blood pressure returned to baseline and stable Postop Assessment: no apparent nausea or vomiting Anesthetic complications: no     Last Vitals:  Vitals:   10/08/19 1801 10/08/19 1815  BP: 132/70 132/70  Pulse: 87 84  Resp: 14 18  Temp: (!) 36.2 C   SpO2: 97% 100%    Last Pain:  Vitals:   10/09/19 0834  TempSrc:   PainSc: 0-No pain                 Molli Barrows

## 2019-11-24 DIAGNOSIS — Z9889 Other specified postprocedural states: Secondary | ICD-10-CM | POA: Insufficient documentation

## 2020-02-12 ENCOUNTER — Telehealth: Payer: Self-pay

## 2020-02-12 NOTE — Telephone Encounter (Signed)
Patient called regarding her appointment last week with Dr. Laurence Ferrari. She states on her face, below where Dr. Laurence Ferrari froze some AKs on her left cheek is now a itchy red rash. She would like to know can we send something in for her or do you want patient to come in and be seen?

## 2020-02-13 ENCOUNTER — Telehealth: Payer: Self-pay

## 2020-02-13 NOTE — Telephone Encounter (Signed)
Error

## 2020-02-13 NOTE — Telephone Encounter (Signed)
Called patient discussed information per Dr. Laurence Ferrari. Patient advised to call back if OTC Hydrocortisone 1% Cream does not improve the area.

## 2020-02-13 NOTE — Telephone Encounter (Signed)
Please confirm patient is not applying anything like neosporin to the area (and let me know if she is). Bandaids can also cause an itchy rash if applied to the area. Please advise she can use OTC hydrocortisone 1% cream 3 times a day for a week and if this does not calm things down to please call us so I can do a video or in person visit to check the site. Thank you!

## 2020-03-13 ENCOUNTER — Other Ambulatory Visit: Payer: Self-pay

## 2020-03-13 ENCOUNTER — Ambulatory Visit: Payer: BC Managed Care – PPO | Admitting: Dermatology

## 2020-03-13 DIAGNOSIS — L821 Other seborrheic keratosis: Secondary | ICD-10-CM

## 2020-03-13 DIAGNOSIS — Z872 Personal history of diseases of the skin and subcutaneous tissue: Secondary | ICD-10-CM | POA: Diagnosis not present

## 2020-03-13 DIAGNOSIS — Q828 Other specified congenital malformations of skin: Secondary | ICD-10-CM | POA: Diagnosis not present

## 2020-03-13 DIAGNOSIS — L578 Other skin changes due to chronic exposure to nonionizing radiation: Secondary | ICD-10-CM

## 2020-03-13 NOTE — Progress Notes (Signed)
   Follow-Up Visit   Subjective  [name] is a 64 y.o. female who presents for the following: Follow-up.  Follow up for AK at left cheek treated with LN2 5 weeks ago. Pt advises it is clear. 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 Pt advises no change. Patient would like spot at left ankle checked. Present for months, continues to get scaly.   The following portions of the chart were reviewed this encounter and updated as appropriate: Tobacco  Allergies  Meds  Problems  Med Hx  Surg Hx  Fam Hx     Review of Systems: No other skin or systemic complaints.  Objective  Well appearing patient in no apparent distress; mood and affect are within normal limits.  A focused examination was performed including face, chest, left leg, arms. Relevant physical exam findings are noted in the Assessment and Plan.  Objective  Left ankle: Scaly pink plaque  Objective  Right Superior Breast: 0.6cm scaly pink papule  Assessment & Plan  Porokeratosis Left ankle  Benign, observe.    Seborrheic keratosis Right Superior Breast  Benign-appearing.  Observation.  Call clinic for new or changing moles.  Recommend daily use of broad spectrum spf 30+ sunscreen to sun-exposed areas.     Seborrheic Keratoses - Stuck-on, waxy, tan-brown papules and plaques  - Discussed benign etiology and prognosis. - Observe - Call for any changes  Actinic Damage - diffuse scaly erythematous macules with underlying dyspigmentation - Recommend daily broad spectrum sunscreen SPF 30+ to sun-exposed areas, reapply every 2 hours as needed.  - Call for new or changing lesions.  Return in about 11 months (around 01/28/2021) for TBSE.   Graciella Belton, RMA, am acting as scribe for Forest Gleason, MD .  Documentation: I have reviewed the above documentation for accuracy and completeness, and I agree with the above.  Forest Gleason, MD

## 2020-03-13 NOTE — Patient Instructions (Addendum)
Recommend daily broad spectrum sunscreen SPF 30+ to sun-exposed areas, reapply every 2 hours as needed. Call for new or changing lesions.  Recommend taking Heliocare sun protection supplement daily in sunny weather for additional sun protection. For maximum protection on the sunniest days, you can take up to 2 capsules of regular Heliocare OR take 1 capsule of Heliocare Ultra. For prolonged exposure (such as a full day in the sun), you can repeat your dose of the supplement 4 hours after your first dose. Heliocare can be purchased at [facility] or at [email or web address].   

## 2020-03-27 ENCOUNTER — Encounter: Payer: Self-pay | Admitting: Dermatology

## 2020-08-15 ENCOUNTER — Other Ambulatory Visit: Payer: Self-pay | Admitting: Internal Medicine

## 2020-08-15 DIAGNOSIS — Z1231 Encounter for screening mammogram for malignant neoplasm of breast: Secondary | ICD-10-CM

## 2020-08-18 ENCOUNTER — Other Ambulatory Visit: Payer: Self-pay

## 2020-08-18 ENCOUNTER — Ambulatory Visit
Admission: RE | Admit: 2020-08-18 | Discharge: 2020-08-18 | Disposition: A | Discharge status: Home / Self Care | Payer: BC Managed Care – PPO | Source: Ambulatory Visit | Attending: Internal Medicine | Admitting: Internal Medicine

## 2020-08-18 DIAGNOSIS — Z1231 Encounter for screening mammogram for malignant neoplasm of breast: Secondary | ICD-10-CM | POA: Insufficient documentation

## 2020-08-20 ENCOUNTER — Inpatient Hospital Stay
Admission: RE | Admit: 2020-08-20 | Discharge: 2020-08-20 | Disposition: A | Discharge status: Home / Self Care | Payer: Self-pay | Source: Ambulatory Visit | Attending: *Deleted | Admitting: *Deleted

## 2020-08-20 ENCOUNTER — Other Ambulatory Visit: Payer: Self-pay | Admitting: *Deleted

## 2020-08-20 DIAGNOSIS — Z1231 Encounter for screening mammogram for malignant neoplasm of breast: Secondary | ICD-10-CM

## 2020-09-30 ENCOUNTER — Telehealth: Payer: Self-pay

## 2020-09-30 NOTE — Telephone Encounter (Signed)
Returned patients call. Pt had questions regarding her upcoming appt. Informed pt visit will be a telephone call. Pt verbalized understanding.

## 2020-10-06 ENCOUNTER — Telehealth: Payer: BC Managed Care – PPO

## 2020-10-17 LAB — EXTERNAL GENERIC LAB PROCEDURE: COLOGUARD: NEGATIVE

## 2020-10-17 LAB — COLOGUARD: COLOGUARD: NEGATIVE

## 2020-11-11 ENCOUNTER — Encounter: Payer: Self-pay | Admitting: Dermatology

## 2020-11-11 ENCOUNTER — Ambulatory Visit: Payer: BC Managed Care – PPO | Admitting: Dermatology

## 2020-11-11 ENCOUNTER — Other Ambulatory Visit: Payer: Self-pay

## 2020-11-11 DIAGNOSIS — L719 Rosacea, unspecified: Secondary | ICD-10-CM | POA: Diagnosis not present

## 2020-11-11 DIAGNOSIS — L570 Actinic keratosis: Secondary | ICD-10-CM | POA: Diagnosis not present

## 2020-11-11 DIAGNOSIS — D2372 Other benign neoplasm of skin of left lower limb, including hip: Secondary | ICD-10-CM

## 2020-11-11 DIAGNOSIS — Q828 Other specified congenital malformations of skin: Secondary | ICD-10-CM | POA: Diagnosis not present

## 2020-11-11 DIAGNOSIS — L720 Epidermal cyst: Secondary | ICD-10-CM | POA: Diagnosis not present

## 2020-11-11 NOTE — Progress Notes (Addendum)
Follow-Up Visit   Subjective  [name] is a 64 y.o. female who presents for the following: Skin Problem (Patient has a spot at right knee, present for a few months, sensitive to touch and feels like needles sticking her. She also has been having redness with warmth across cheeks and nose, mainly cheeks. Patient also has what it like a pimple at right groin area that she is able to get white drainage from it.).  There is also a spot above left ankle that scales and peels. She would like to know if there is a cream to help it look and feel better.   The following portions of the chart were reviewed this encounter and updated as appropriate:   Tobacco  Allergies  Meds  Problems  Med Hx  Surg Hx  Fam Hx      Review of Systems:  No other skin or systemic complaints except as noted in HPI or Assessment and Plan.  Objective  Well appearing patient in no apparent distress; mood and affect are within normal limits.  A focused examination was performed including face, groin, legs. Relevant physical exam findings are noted in the Assessment and Plan.  Objective  face: Mid face erythema  Objective  Right Knee: Erythematous thin papules/macules with gritty scale.   Objective  Right Inguinal Area: 0.5cm subcutaneous papule  Objective  Left Ankle: Inflamed and symptomatic, scaly pink papule with peripheral scale   Assessment & Plan  Rosacea face  Chronic condition with expected duration over one year. There is no cure, only control. Condition is bothersome to patient with redness and hot sensation to the mid-face. Currently flared.  No grittiness or irritation of the eyes.   Discussed BBL laser, rhofade to target redness. Discussed Soolantra and other topicals to help calm inflammation.   Start a thin layer twice daily of Soolantra. If works well for patient, we will prescribe. Sample given.  Lot #[id number] Exp: 08/23 Consider [facility] prescription  metronidazole/ivermectin/azelaic acid if Soolantra not covered or too expensive.  Sample of Rhofade given to patient.  Lot #[id number]  Exp: Aug 21  Rosacea is a chronic progressive skin condition usually affecting the face of adults, causing redness and/or acne bumps. It is treatable but not curable. It sometimes affects the eyes (ocular rosacea) as well. It may respond to topical and/or systemic medication and can flare with stress, sun exposure, alcohol, exercise and some foods.  Daily application of broad spectrum spf 30+ sunscreen to face is recommended to reduce flares.    AK (actinic keratosis) Right Knee  Hypertrophic at right knee  Topical steroids (such as triamcinolone, fluocinolone, fluocinonide, mometasone, clobetasol, halobetasol, betamethasone, hydrocortisone) can cause thinning and lightening of the skin if they are used for too long in the same area. Your physician has selected the right strength medicine for your problem and area affected on the body. Please use your medication only as directed by your physician to prevent side effects.    Destruction of lesion - Right Knee Complexity: simple   Destruction method: cryotherapy   Informed consent: discussed and consent obtained   Lesion destroyed using liquid nitrogen: Yes   Cryotherapy cycles:  2 Outcome: patient tolerated procedure well with no complications   Post-procedure details: wound care instructions given    Epidermal inclusion cyst Right Inguinal Area  Benign-appearing. Exam most consistent with an epidermal inclusion cyst. Discussed that a cyst is a benign growth that can grow over time and sometimes get irritated or  inflamed. Recommend observation if it is not bothersome. Discussed option of surgical excision to remove it if it is growing, symptomatic, or other changes noted. It is symptomatic and she will schedule excision.  Please call for new or changing lesions so they can be evaluated.    Other benign  neoplasm of skin of left lower limb, including hip Left Ankle  Favor porokeratosis. Benign appearing on dermoscopy. Symptomatic, inflamed  Prior to procedure, discussed risks of blister formation, small wound, skin dyspigmentation, or rare scar following cryotherapy.   Call if does not resolve  Destruction of lesion - Left Ankle Complexity: simple   Destruction method: cryotherapy   Informed consent: discussed and consent obtained   Lesion destroyed using liquid nitrogen: Yes   Cryotherapy cycles:  2 Outcome: patient tolerated procedure well with no complications   Post-procedure details: wound care instructions given    Return as scheduled, for TBSE, surgery for cyst, 4-6 wk rosacea/AK follow up.  Graciella Belton, RMA, am acting as scribe for Forest Gleason, MD .  Documentation: I have reviewed the above documentation for accuracy and completeness, and I agree with the above.  Forest Gleason, MD

## 2020-11-11 NOTE — Patient Instructions (Addendum)
Rosacea  What is rosacea? Rosacea (say: ro-zay-sha) is a common skin disease that usually begins as a trend of flushing or blushing easily.  As rosacea progresses, a persistent redness in the center of the face will develop and may gradually spread beyond the nose and cheeks to the forehead and chin.  In some cases, the ears, chest, and back could be affected.  Rosacea may appear as tiny blood vessels or small red bumps that occur in crops.  Frequently they can contain pus, and are called "pustules".  If the bumps do not contain pus, they are referred to as "papules".  Rarely, in prolonged, untreated cases of rosacea, the oil glands of the nose and cheeks may become permanently enlarged.  This is called rhinophyma, and is seen more frequently in men.  Signs and Risks In its beginning stages, rosacea tends to come and go, which makes it difficult to recognize.  It can start as intermittent flushing of the face.  Eventually, blood vessels may become permanently visible.  Pustules and papules can appear, but can be mistaken for adult acne.  People of all races, ages, genders and ethnic groups are at risk of developing rosacea.  However, it is more common in women (especially around menopause) and adults with fair skin between the ages of 30 and 50.  Treatment Dermatologists typically recommend a combination of treatments to effectively manage rosacea.  Treatment can improve symptoms and may stop the progression of the rosacea.  Treatment may involve both topical and oral medications.  The tetracycline antibiotics are often used for their anti-inflammatory effect; however, because of the possibility of developing antibiotic resistance, they should not be used long term at full dose.  For dilated blood vessels the options include electrodessication (uses electric current through a small needle), laser treatment, and cosmetics to hide the redness.   With all forms of treatment, improvement is a slow process, and  patients may not see any results for the first 3-4 weeks.  It is very important to avoid the sun and other triggers.  Patients must wear sunscreen daily.  Skin Care Instructions: 1. Cleanse the skin with a mild soap such as CeraVe cleanser, Cetaphil cleanser, or Dove soap once or twice daily as needed. 2. Moisturize with Eucerin Redness Relief Daily Perfecting Lotion (has a subtle green tint), CeraVe Moisturizing Cream, or Oil of Olay Daily Moisturizer with sunscreen every morning and/or night as recommended. 3. Makeup should be "non-comedogenic" (won't clog pores) and be labeled "for sensitive skin". Good choices for cosmetics are: Neutrogena, Almay, and Physician's Formula.  Any product with a green tint tends to offset a red complexion. 4. If your eyes are dry and irritated, use artificial tears 2-3 times per day and cleanse the eyelids daily with baby shampoo.  Have your eyes examined at least every 2 years.  Be sure to tell your eye doctor that you have rosacea. 5. Alcoholic beverages tend to cause flushing of the skin, and may make rosacea worse. 6. Always wear sunscreen, protect your skin from extreme hot and cold temperatures, and avoid spicy foods, hot drinks, and mechanical irritation such as rubbing, scrubbing, or massaging the face.  Avoid harsh skin cleansers, cleansing masks, astringents, and exfoliation. If a particular product burns or makes your face feel tight, then it is likely to flare your rosacea. 7. If you are having difficulty finding a sunscreen that you can tolerate, you may try switching to a chemical-free sunscreen.  These are ones whose active   ingredient is zinc oxide or titanium dioxide only.  They should also be fragrance free, non-comedogenic, and labeled for sensitive skin. 8. Rosacea triggers may vary from person to person.  There are a variety of foods that have been reported to trigger rosacea.  Some patients find that keeping a diary of what they were doing when they  flared helps them avoid triggers.  Cryotherapy Aftercare  . Wash gently with soap and water everyday.   Marland Kitchen Apply Vaseline and Band-Aid daily until healed.  Prior to procedure, discussed risks of blister formation, small wound, skin dyspigmentation, or rare scar following cryotherapy.    Mix 1 bottle of Afrin original in 1 bottle of CeraVe PM, shake well. Apply thin layer to affected areas at face 1 hour before leaving house in the am.     Pre-Operative Instructions  You are scheduled for a surgical procedure at [facility]. We recommend you read the following instructions. If you have any questions or concerns, please call the office at [phone].  1. Shower and wash the entire body with soap and water the day of your surgery paying special attention to cleansing at and around the planned surgery site.  2. Avoid aspirin or aspirin containing products at least fourteen (14) days prior to your surgical procedure and for at least one week (7 Days) after your surgical procedure. If you take aspirin on a regular basis for heart disease or history of stroke or for any other reason, we may recommend you continue taking aspirin but please notify us if you take this on a regular basis. Aspirin can cause more bleeding to occur during surgery as well as prolonged bleeding and bruising after surgery.   3. Avoid other nonsteroidal pain medications at least one week prior to surgery and at least one week prior to your surgery. These include medications such as Ibuprofen (Motrin, Advil and Nuprin), Naprosyn, Voltaren, Relafen, etc. If medications are used for therapeutic reasons, please inform us as they can cause increased bleeding or prolonged bleeding during and bruising after surgical procedures.   4. Please advise [place] if you are taking any "blood thinner" medications such as Coumadin or Dipyridamole or Plavix or similar medications. These cause increased bleeding and prolonged bleeding during  procedures and bruising after surgical procedures. We may have to consider discontinuing these medications briefly prior to and shortly after your surgery if safe to do so.   5. Please inform us of all medications you are currently taking. All medications that are taken regularly should be taken the day of surgery as you always do. Nevertheless, we need to be informed of what medications you are taking prior to surgery to know whether they will affect the procedure or cause any complications.   6. Please inform us of any medication allergies. Also inform us of whether you have allergies to Latex or rubber products or whether you have had any adverse reaction to Lidocaine or Epinephrine.  7. Please inform us of any prosthetic or artificial body parts such as artificial heart valve, joint replacements, etc., or similar condition that might require preoperative antibiotics.   8. We recommend avoidance of alcohol at least two weeks prior to surgery and continued avoidance for at least two weeks after surgery.   9. We recommend discontinuation of tobacco smoking at least two weeks prior to surgery and continued abstinence for at least two weeks after surgery.  10. Do not plan strenuous exercise, strenuous work or strenuous lifting for approximately  four weeks after your surgery.   11. We request if you are unable to make your scheduled surgical appointment, please call us at least a week in advance or as soon as you are aware of a problem so that we can cancel or reschedule the appointment.   12. You MAY TAKE TYLENOL (acetaminophen) for pain as it is not a blood thinner.   13. PLEASE PLAN TO BE IN TOWN FOR TWO WEEKS FOLLOWING SURGERY, THIS IS IMPORTANT SO YOU CAN BE CHECKED FOR DRESSING CHANGES, SUTURE REMOVAL AND TO MONITOR FOR POSSIBLE COMPLICATIONS.

## 2020-11-26 ENCOUNTER — Other Ambulatory Visit: Payer: Self-pay

## 2020-11-26 DIAGNOSIS — L719 Rosacea, unspecified: Secondary | ICD-10-CM

## 2020-11-26 MED ORDER — RHOFADE 1 % EX CREA: TOPICAL_CREAM | CUTANEOUS | 5 refills | Status: DC

## 2021-01-05 ENCOUNTER — Encounter: Payer: BC Managed Care – PPO | Admitting: Dermatology

## 2021-01-28 ENCOUNTER — Other Ambulatory Visit: Payer: Self-pay

## 2021-01-28 ENCOUNTER — Ambulatory Visit: Payer: BC Managed Care – PPO | Admitting: Dermatology

## 2021-01-28 ENCOUNTER — Encounter: Payer: Self-pay | Admitting: Dermatology

## 2021-01-28 DIAGNOSIS — L719 Rosacea, unspecified: Secondary | ICD-10-CM

## 2021-01-28 DIAGNOSIS — B078 Other viral warts: Secondary | ICD-10-CM

## 2021-01-28 DIAGNOSIS — L570 Actinic keratosis: Secondary | ICD-10-CM | POA: Diagnosis not present

## 2021-01-28 DIAGNOSIS — Z872 Personal history of diseases of the skin and subcutaneous tissue: Secondary | ICD-10-CM | POA: Diagnosis not present

## 2021-01-28 DIAGNOSIS — L720 Epidermal cyst: Secondary | ICD-10-CM

## 2021-01-28 DIAGNOSIS — D485 Neoplasm of uncertain behavior of skin: Secondary | ICD-10-CM

## 2021-01-28 MED ORDER — MUPIROCIN 2 % EX OINT: 1.0000 "application " | TOPICAL_OINTMENT | Freq: Every day | CUTANEOUS | 1 refills | Status: DC

## 2021-01-28 NOTE — Progress Notes (Signed)
Follow-Up Visit   Subjective  [name] is a 65 y.o. female who presents for the following: Procedure (Patient here today for excision of cyst at right inguinal area. ) and Rosacea (Patient following up also for rosacea. She is using Rhofade and is pleased with treatment. ).  Patient also has a spot at left eye and left arm that she would like checked.   The following portions of the chart were reviewed this encounter and updated as appropriate:   Tobacco  Allergies  Meds  Problems  Med Hx  Surg Hx  Fam Hx      Review of Systems:  No other skin or systemic complaints except as noted in HPI or Assessment and Plan.  Objective  Well appearing patient in no apparent distress; mood and affect are within normal limits.  A focused examination was performed including face, arms, right inguinal area. Relevant physical exam findings are noted in the Assessment and Plan.  Objective  Right inferior supra pubic: 0.8cm firm subcutaneous nodule  Objective  Left medial ankle x 1, left forearm x 1 (2): Erythematous thin papules/macules with gritty scale.   Objective  Left nasal bridge: Verrucous papule   Assessment & Plan  Neoplasm of uncertain behavior of skin Right inferior supra pubic  Skin excision  Lesion length (cm):  0.8 Lesion width (cm):  0.8 Total excision diameter (cm):  0.8 Informed consent: discussed and consent obtained   Timeout: patient name, date of birth, surgical site, and procedure verified   Procedure prep:  Patient was prepped and draped in usual sterile fashion Prep type:  Chlorhexidine Anesthesia: the lesion was anesthetized in a standard fashion   Anesthetic:  1% lidocaine w/ epinephrine 1-100,000 buffered w/ 8.4% NaHCO3 Instrument used comment:  15c Hemostasis achieved with: suture, pressure and electrodesiccation   Outcome: patient tolerated procedure well with no complications   Post-procedure details: wound care instructions given    Additional details:  Mupirocin and a pressure dressing applied  Skin repair Complexity:  Intermediate Final length (cm):  1.1 Informed consent: discussed and consent obtained   Timeout: patient name, date of birth, surgical site, and procedure verified   Procedure prep:  Patient was prepped and draped in usual sterile fashion Prep type:  Chlorhexidine Anesthesia: the lesion was anesthetized in a standard fashion   Anesthesia comment:  3cc Anesthetic:  1% lidocaine w/ epinephrine 1-100,000 local infiltration Reason for type of repair: reduce tension to allow closure, reduce the risk of dehiscence, infection, and necrosis and enhance both functionality and cosmetic results   Undermining: edges undermined   Subcutaneous layers (deep stitches):  Suture size:  4-0 Suture type: Vicryl (polyglactin 910)   Stitches:  Buried vertical mattress Fine/surface layer approximation (top stitches):  Suture size:  4-0 Suture type: Prolene (polypropylene)   Suture removal (days):  7 Hemostasis achieved with: suture, pressure and electrodesiccation Outcome: patient tolerated procedure well with no complications   Post-procedure details: wound care instructions given   Additional details:  Mupirocin and a pressure dressing applied  Specimen 1 - Surgical pathology Differential Diagnosis: r/o cyst vs Other  Check Margins: No 0.8cm firm subcutaneous nodule  AK (actinic keratosis) (2) Left medial ankle x 1, left forearm x 1  Prior to procedure, discussed risks of blister formation, small wound, skin dyspigmentation, or rare scar following cryotherapy.    Destruction of lesion - Left medial ankle x 1, left forearm x 1 Complexity: simple   Destruction method: cryotherapy   Informed  consent: discussed and consent obtained   Lesion destroyed using liquid nitrogen: Yes   Cryotherapy cycles:  2 Outcome: patient tolerated procedure well with no complications   Post-procedure details: wound care  instructions given    Other viral warts Left nasal bridge  Vs ISK  Prior to procedure, discussed risks of blister formation, small wound, skin dyspigmentation, or rare scar following cryotherapy.    Destruction of lesion - Left nasal bridge Complexity: simple   Destruction method: cryotherapy   Informed consent: discussed and consent obtained   Lesion destroyed using liquid nitrogen: Yes   Cryotherapy cycles:  2 Outcome: patient tolerated procedure well with no complications   Post-procedure details: wound care instructions given    History of PreCancerous Actinic Keratosis  - site of PreCancerous Actinic Keratosis at right knee clear today s/p cryotherapy - these may recur and new lesions may form requiring treatment to prevent transformation into skin cancer - observe for new or changing spots and contact [place] for appointment if occur - photoprotection with sun protective clothing; sunglasses and broad spectrum sunscreen with SPF of at least 30 + and frequent self skin exams recommended - yearly exams by a dermatologist recommended for persons with history of PreCancerous Actinic Keratoses  Return in about 1 week (around 02/04/2021) for Suture Removal.  Graciella Belton, RMA, am acting as scribe for Forest Gleason, MD .  Documentation: I have reviewed the above documentation for accuracy and completeness, and I agree with the above.  Forest Gleason, MD

## 2021-01-28 NOTE — Patient Instructions (Addendum)
Wound Care Instructions  1. Cleanse wound gently with soap and water once a day then pat dry with clean gauze. Apply a thing coat of Petrolatum (petroleum jelly, "Vaseline") over the wound (unless you have an allergy to this). We recommend that you use a new, sterile tube of Vaseline. Do not pick or remove scabs. Do not remove the yellow or white "healing tissue" from the base of the wound.  2. Cover the wound with fresh, clean, nonstick gauze and secure with paper tape. You may use Band-Aids in place of gauze and tape if the would is small enough, but would recommend trimming much of the tape off as there is often too much. Sometimes Band-Aids can irritate the skin.  3. You should call the office for your biopsy report after 1 week if you have not already been contacted.  4. If you experience any problems, such as abnormal amounts of bleeding, swelling, significant bruising, significant pain, or evidence of infection, please call the office immediately.  5. FOR ADULT SURGERY PATIENTS: If you need something for pain relief you may take 1 extra strength Tylenol (acetaminophen) AND 2 Ibuprofen (200mg  each) together every 4 hours as needed for pain. (do not take these if you are allergic to them or if you have a reason you should not take them.) Typically, you may only need pain medication for 1 to 3 days.    Cryotherapy Aftercare  . Wash gently with soap and water everyday.   Marland Kitchen Apply Vaseline and Band-Aid daily until healed.  Prior to procedure, discussed risks of blister formation, small wound, skin dyspigmentation, or rare scar following cryotherapy.    If you have an issue when the clinic is closed that cannot wait until the next business day, you can page your doctor at the number below.   Please note that while we do our best to be available for urgent issues outside of office hours, we are not available 24/7.  If you have a medical emergency and do not hear back from your doctor promptly,  please seek medical care at your doctor's office, retail clinic, urgent care center or emergency room.  Pager Numbers  Dr. Nehemiah Massed: [phone]  Dr. Laurence Ferrari: [phone]  Dr. Nicole Kindred: [phone]

## 2021-01-29 ENCOUNTER — Other Ambulatory Visit: Payer: Self-pay

## 2021-01-29 MED ORDER — MUPIROCIN 2 % EX OINT: 1.0000 "application " | TOPICAL_OINTMENT | Freq: Every day | CUTANEOUS | 1 refills | Status: DC

## 2021-01-29 NOTE — Telephone Encounter (Signed)
Patient doing well after yesterdays surgery, [name] 

## 2021-02-04 ENCOUNTER — Other Ambulatory Visit: Payer: Self-pay

## 2021-02-04 ENCOUNTER — Ambulatory Visit: Payer: BC Managed Care – PPO | Admitting: Dermatology

## 2021-02-04 DIAGNOSIS — Z4802 Encounter for removal of sutures: Secondary | ICD-10-CM

## 2021-02-04 NOTE — Progress Notes (Signed)
   Follow-Up Visit   Subjective  [name] is a 65 y.o. female who presents for the following: Follow-up (Patient here today for suture removal at the right inferior supra pubic showing benign cyst. ).  The following portions of the chart were reviewed this encounter and updated as appropriate:   Tobacco  Allergies  Meds  Problems  Med Hx  Surg Hx  Fam Hx      Review of Systems:  No other skin or systemic complaints except as noted in HPI or Assessment and Plan.  Objective  Well appearing patient in no apparent distress; mood and affect are within normal limits.  A focused examination was performed including pubic. Relevant physical exam findings are noted in the Assessment and Plan.    Assessment & Plan    Encounter for Removal of Sutures - Incision site at the right inferior supra pubic is clean, dry and intact - Wound cleansed, sutures removed, wound cleansed and steri strips applied.  - Discussed pathology results showing benign cyst  - Patient advised to keep steri-strips dry until they fall off. - Scars remodel for a full year. - Once steri-strips fall off, patient can apply over-the-counter silicone scar cream each night to help with scar remodeling if desired. - Patient advised to call with any concerns or if they notice any new or changing lesions.   Return for as scheduled.  Graciella Belton, RMA, am acting as scribe for Forest Gleason, MD .  Documentation: I have reviewed the above documentation for accuracy and completeness, and I agree with the above.  Forest Gleason, MD

## 2021-02-05 ENCOUNTER — Encounter: Payer: Self-pay | Admitting: Dermatology

## 2021-02-09 ENCOUNTER — Encounter: Payer: Self-pay | Admitting: Dermatology

## 2021-03-22 DIAGNOSIS — M1711 Unilateral primary osteoarthritis, right knee: Secondary | ICD-10-CM | POA: Insufficient documentation

## 2021-04-16 ENCOUNTER — Other Ambulatory Visit: Payer: Self-pay

## 2021-04-16 ENCOUNTER — Encounter: Payer: Self-pay | Admitting: Dermatology

## 2021-04-16 ENCOUNTER — Ambulatory Visit: Payer: BC Managed Care – PPO | Admitting: Dermatology

## 2021-04-16 DIAGNOSIS — L578 Other skin changes due to chronic exposure to nonionizing radiation: Secondary | ICD-10-CM

## 2021-04-16 DIAGNOSIS — Z1283 Encounter for screening for malignant neoplasm of skin: Secondary | ICD-10-CM | POA: Diagnosis not present

## 2021-04-16 DIAGNOSIS — D239 Other benign neoplasm of skin, unspecified: Secondary | ICD-10-CM

## 2021-04-16 DIAGNOSIS — L905 Scar conditions and fibrosis of skin: Secondary | ICD-10-CM

## 2021-04-16 DIAGNOSIS — L821 Other seborrheic keratosis: Secondary | ICD-10-CM

## 2021-04-16 DIAGNOSIS — D229 Melanocytic nevi, unspecified: Secondary | ICD-10-CM

## 2021-04-16 DIAGNOSIS — L820 Inflamed seborrheic keratosis: Secondary | ICD-10-CM | POA: Diagnosis not present

## 2021-04-16 DIAGNOSIS — L219 Seborrheic dermatitis, unspecified: Secondary | ICD-10-CM

## 2021-04-16 DIAGNOSIS — D2362 Other benign neoplasm of skin of left upper limb, including shoulder: Secondary | ICD-10-CM

## 2021-04-16 DIAGNOSIS — D1800 Hemangioma unspecified site: Secondary | ICD-10-CM

## 2021-04-16 DIAGNOSIS — L814 Other melanin hyperpigmentation: Secondary | ICD-10-CM

## 2021-04-16 MED ORDER — HYDROCORTISONE 2.5 % EX CREA: TOPICAL_CREAM | CUTANEOUS | 0 refills | Status: DC

## 2021-04-16 NOTE — Patient Instructions (Addendum)
If you have any questions or concerns for your doctor, please call our main line at [phone] and press option 4 to reach your doctor's medical assistant. If no one answers, please leave a voicemail as directed and we will return your call as soon as possible. Messages left after 4 pm will be answered the following business day.   You may also send [place] a message via [place]. We typically respond to MyChart messages within 1-2 business days.  For prescription refills, please ask your pharmacy to contact our office. Our fax number is [phone].  If you have an urgent issue when the clinic is closed that cannot wait until the next business day, you can page your doctor at the number below.    Please note that while we do our best to be available for urgent issues outside of office hours, we are not available 24/7.   If you have an urgent issue and are unable to reach [place], you may choose to seek medical care at your doctor's office, retail clinic, urgent care center, or emergency room.  If you have a medical emergency, please immediately call 911 or go to the emergency department.  Pager Numbers  - Dr. Nehemiah Massed: [phone]  - Dr. Laurence Ferrari: [phone]  - Dr. Nicole Kindred: [phone]  In the event of inclement weather, please call our main line at [phone] for an update on the status of any delays or closures.  Dermatology Medication Tips: Please keep the boxes that topical medications come in in order to help keep track of the instructions about where and how to use these. Pharmacies typically print the medication instructions only on the boxes and not directly on the medication tubes.   If your medication is too expensive, please contact our office at [phone] option 4 or send [place] a message through [place].   We are unable to tell what your co-pay for medications will be in advance as this is different depending on your insurance coverage. However, we may be able to find a substitute  medication at lower cost or fill out paperwork to get insurance to cover a needed medication.   If a prior authorization is required to get your medication covered by your insurance company, please allow [place] 1-2 business days to complete this process.  Drug prices often vary depending on where the prescription is filled and some pharmacies may offer cheaper prices.  The website www.goodrx.com contains coupons for medications through different pharmacies. The prices here do not account for what the cost may be with help from insurance (it may be cheaper with your insurance), but the website can give you the price if you did not use any insurance.  - You can print the associated coupon and take it with your prescription to the pharmacy.  - You may also stop by our office during regular business hours and pick up a GoodRx coupon card.  - If you need your prescription sent electronically to a different pharmacy, notify our office through [facility] or by phone at [phone] option 4.   Recommend Serica moisturizing scar formula cream every night or [facility] brand or Mederma silicone scar sheet every night for the first year after a scar appears to help with scar remodeling if desired. Scars remodel on their own for a full year.  Recommend taking Heliocare sun protection supplement daily in sunny weather for additional sun protection. For maximum protection on the sunniest days, you can take up to 2 capsules of regular Heliocare  OR take 1 capsule of Heliocare Ultra. For prolonged exposure (such as a full day in the sun), you can repeat your dose of the supplement 4 hours after your first dose. Heliocare can be purchased at [facility] or at [email or web address].

## 2021-04-16 NOTE — Progress Notes (Signed)
Follow-Up Visit   Subjective  [name] is a 65 y.o. female who presents for the following: Annual Exam (Here for skin cancer screening. Full body. No hx of skin cancer. Hx of AK's. ) and lesion (Check spot on right abdomen. Rubbed, irritated. ).  The following portions of the chart were reviewed this encounter and updated as appropriate:   Tobacco  Allergies  Meds  Problems  Med Hx  Surg Hx  Fam Hx     Review of Systems:  No other skin or systemic complaints except as noted in HPI or Assessment and Plan.  Objective  Well appearing patient in no apparent distress; mood and affect are within normal limits.  A full examination was performed including scalp, head, eyes, ears, nose, lips, neck, chest, axillae, abdomen, back, buttocks, bilateral upper extremities, bilateral lower extremities, hands, feet, fingers, toes, fingernails, and toenails. All findings within normal limits unless otherwise noted below.  Objective  L shoulder: Firm pink/brown papulenodule with dimple sign.   Objective  L nasal alar crease: Scale.  Objective  R lat abdomen x 1: Erythematous keratotic or waxy stuck-on papule or plaque.   Objective  R inf supra pubic area: Dyspigmented smooth macule or patch.   Objective  L upper back: Macular hyperkeratotic papule  Assessment & Plan  Dermatofibroma L shoulder  Benign-appearing.  Observation.  Call clinic for new or changing lesions.  Recommend daily use of broad spectrum spf 30+ sunscreen to sun-exposed areas.    Seborrheic dermatitis L nasal alar crease  Seborrheic Dermatitis  -  is a chronic persistent rash characterized by pinkness and scaling most commonly of the mid face but also can occur on the scalp (dandruff), ears; mid chest and mid back. It tends to be exacerbated by stress and cooler weather.  People who have neurologic disease may experience new onset or exacerbation of existing seborrheic dermatitis.  The condition is  not curable but treatable and can be controlled.  Start HC 2.5% cream BID x 1 week. Topical steroids (such as triamcinolone, fluocinolone, fluocinonide, mometasone, clobetasol, halobetasol, betamethasone, hydrocortisone) can cause thinning and lightening of the skin if they are used for too long in the same area. Your physician has selected the right strength medicine for your problem and area affected on the body. Please use your medication only as directed by your physician to prevent side effects.   If recurs, please advise and would switch to ketoconazole cream or elidel cream to prevent atrophy   hydrocortisone 2.5 % cream - L nasal alar crease  Inflamed seborrheic keratosis R lat abdomen x 1  Liquid nitrogen was applied for 10-12 seconds to the skin lesion and the expected blistering or scabbing reaction explained. Do not pick at the area. Patient reminded to expect hypopigmented scars from the procedure. Return if lesion fails to fully resolve.   Destruction of lesion - R lat abdomen x 1 Complexity: simple   Destruction method: cryotherapy   Informed consent: discussed and consent obtained   Timeout:  patient name, date of birth, surgical site, and procedure verified Lesion destroyed using liquid nitrogen: Yes   Region frozen until ice ball extended beyond lesion: Yes   Outcome: patient tolerated procedure well with no complications   Post-procedure details: wound care instructions given    Scar conditions and fibrosis of skin R inf supra pubic area  S/P cyst excision - Benign-appearing.  Observation.  Call clinic for new or changing lesions.  Recommend daily use of  broad spectrum spf 30+ sunscreen to sun-exposed areas.   Recommend Serica moisturizing scar formula cream every night or [facility] brand or Mederma silicone scar sheet every night for the first year after a scar appears to help with scar remodeling if desired. Scars remodel on their own for a full  year.   Seborrheic keratosis L upper back  Benign-appearing.  Observation.  Call clinic for new or changing lesions.  Recommend daily use of broad spectrum spf 30+ sunscreen to sun-exposed areas.     Lentigines - Scattered tan macules - Due to sun exposure - Benign-appering, observe - Recommend daily broad spectrum sunscreen SPF 30+ to sun-exposed areas, reapply every 2 hours as needed. - Call for any changes  Seborrheic Keratoses - Stuck-on, waxy, tan-brown papules and/or plaques  - Benign-appearing - Discussed benign etiology and prognosis. - Observe - Call for any changes  Melanocytic Nevi - Tan-brown and/or pink-flesh-colored symmetric macules and papules - Benign appearing on exam today - Observation - Call clinic for new or changing moles - Recommend daily use of broad spectrum spf 30+ sunscreen to sun-exposed areas.   Hemangiomas - Red papules - Discussed benign nature - Observe - Call for any changes  Actinic Damage - Chronic condition, secondary to cumulative UV/sun exposure - diffuse scaly erythematous macules with underlying dyspigmentation - Recommend daily broad spectrum sunscreen SPF 30+ to sun-exposed areas, reapply every 2 hours as needed.  - Staying in the shade or wearing long sleeves, sun glasses (UVA+UVB protection) and wide brim hats (4-inch brim around the entire circumference of the hat) are also recommended for sun protection.  - Call for new or changing lesions.  Skin cancer screening performed today.  Return in about 1 year (around 04/16/2022) for TBSE.  Luther Redo, CMA, am acting as scribe for Forest Gleason, MD .   Documentation: I have reviewed the above documentation for accuracy and completeness, and I agree with the above.  Forest Gleason, MD

## 2021-04-20 ENCOUNTER — Encounter: Payer: Self-pay | Admitting: Dermatology

## 2021-04-24 NOTE — Discharge Instructions (Signed)
Instructions after Total Knee Replacement   [name] P. Tansy Lorek, Jr., M.D.     Dept. of Orthopaedics & Sports Medicine  [facility]  [address]  [place], [place]  [address]  Phone: [phone]   Fax: [phone]    DIET: Drink plenty of non-alcoholic fluids. Resume your normal diet. Include foods high in fiber.  ACTIVITY:  You may use crutches or a walker with weight-bearing as tolerated, unless instructed otherwise. You may be weaned off of the walker or crutches by your Physical Therapist.  Do NOT place pillows under the knee. Anything placed under the knee could limit your ability to straighten the knee.   Continue doing gentle exercises. Exercising will reduce the pain and swelling, increase motion, and prevent muscle weakness.   Please continue to use the TED compression stockings for 6 weeks. You may remove the stockings at night, but should reapply them in the morning. Do not drive or operate any equipment until instructed.  WOUND CARE:  Continue to use the PolarCare or ice packs periodically to reduce pain and swelling. You may bathe or shower after the staples are removed at the first office visit following surgery.  MEDICATIONS: You may resume your regular medications. Please take the pain medication as prescribed on the medication. Do not take pain medication on an empty stomach. You have been given a prescription for a blood thinner (Lovenox or Coumadin). Please take the medication as instructed. (NOTE: After completing a 2 week course of Lovenox, take one Enteric-coated aspirin once a day. This along with elevation will help reduce the possibility of phlebitis in your operated leg.) Do not drive or drink alcoholic beverages when taking pain medications.  CALL THE OFFICE FOR: Temperature above 101 degrees Excessive bleeding or drainage on the dressing. Excessive swelling, coldness, or paleness of the toes. Persistent nausea and vomiting.  FOLLOW-UP:  You  should have an appointment to return to the office in 10-14 days after surgery. Arrangements have been made for continuation of Physical Therapy (either home therapy or outpatient therapy).   [facility] Department Directory         [email or web address]       [email or web address]          Cardiology  Appointments: [place] - [phone] [place] - [phone]  Endocrinology  Appointments: [place] - [phone] [place] - [phone]  Gastroenterology  Appointments: [place] - [phone] [place] - [phone]        General Surgery   Appointments: [place] - [phone]  Internal Medicine/Family Medicine  Appointments: [place] - [phone] [place] - [phone] [place] - [phone]  Metabolic and Weigh Loss Surgery  Appointments: [place] - [phone]        Neurology  Appointments: [place] - [phone] [place] - [phone]  Neurosurgery  Appointments: [place] - [phone]  Obstetrics & Gynecology  Appointments: [place] - [phone] [place] - [phone]        Pediatrics  Appointments: [place] - [phone] [place] - [phone]  Physiatry  Appointments: [place] -[phone]  Physical Therapy  Appointments: [place] - [phone] [place] - [phone]        Podiatry  Appointments: [place] - [phone] [place] - [phone]  Pulmonology  Appointments: [place] - [phone]  Rheumatology  Appointments: [place] - [phone]        [place] Location: [facility]  [address] , [place]  [address]  [place] Location: [facility] [address] [place], [place]  [address]  [place] Location: [facility] [address] [place], [place]  [address]    

## 2021-05-13 ENCOUNTER — Other Ambulatory Visit: Payer: Self-pay

## 2021-05-13 ENCOUNTER — Other Ambulatory Visit
Admission: RE | Admit: 2021-05-13 | Discharge: 2021-05-13 | Disposition: A | Discharge status: Home / Self Care | Payer: BC Managed Care – PPO | Source: Ambulatory Visit | Attending: Orthopedic Surgery | Admitting: Orthopedic Surgery

## 2021-05-13 DIAGNOSIS — Z01818 Encounter for other preprocedural examination: Secondary | ICD-10-CM | POA: Diagnosis not present

## 2021-05-13 LAB — COMPREHENSIVE METABOLIC PANEL
ALT: 39 U/L (ref 0–44)
AST: 33 U/L (ref 15–41)
Albumin: 4.1 g/dL (ref 3.5–5.0)
Alkaline Phosphatase: 95 U/L (ref 38–126)
Anion gap: 5 (ref 5–15)
BUN: 20 mg/dL (ref 8–23)
CO2: 27 mmol/L (ref 22–32)
Calcium: 9.3 mg/dL (ref 8.9–10.3)
Chloride: 107 mmol/L (ref 98–111)
Creatinine, Ser: 0.66 mg/dL (ref 0.44–1.00)
GFR, Estimated: >60 mL/min (ref >60)
Glucose, Bld: 100 mg/dL — ABNORMAL HIGH (ref 70–99)
Potassium: 4.1 mmol/L (ref 3.5–5.1)
Sodium: 139 mmol/L (ref 135–145)
Total Bilirubin: 1 mg/dL (ref 0.3–1.2)
Total Protein: 7.4 g/dL (ref 6.5–8.1)

## 2021-05-13 LAB — URINALYSIS, ROUTINE W REFLEX MICROSCOPIC
Bilirubin Urine: NEGATIVE
Glucose, UA: NEGATIVE mg/dL
Hgb urine dipstick: NEGATIVE
Ketones, ur: NEGATIVE mg/dL
Leukocytes,Ua: NEGATIVE
Nitrite: NEGATIVE
Protein, ur: NEGATIVE mg/dL
Specific Gravity, Urine: 1.025 (ref 1.005–1.030)
pH: 5 (ref 5.0–8.0)

## 2021-05-13 LAB — TYPE AND SCREEN
ABO/RH(D): O POS
Antibody Screen: NEGATIVE

## 2021-05-13 LAB — SURGICAL PCR SCREEN
MRSA, PCR: NEGATIVE
Staphylococcus aureus: POSITIVE — AB

## 2021-05-13 LAB — C-REACTIVE PROTEIN: CRP: <0.5 mg/dL (ref <1.0)

## 2021-05-13 LAB — CBC
HCT: 38.8 % (ref 36.0–46.0)
Hemoglobin: 12.8 g/dL (ref 12.0–15.0)
MCH: 29.2 pg (ref 26.0–34.0)
MCHC: 33 g/dL (ref 30.0–36.0)
MCV: 88.6 fL (ref 80.0–100.0)
Platelets: 212 10*3/uL (ref 150–400)
RBC: 4.38 MIL/uL (ref 3.87–5.11)
RDW: 12.6 % (ref 11.5–15.5)
WBC: 3 10*3/uL — ABNORMAL LOW (ref 4.0–10.5)
nRBC: 0 % (ref 0.0–0.2)

## 2021-05-13 LAB — PROTIME-INR
INR: 1 (ref 0.8–1.2)
Prothrombin Time: 13 seconds (ref 11.4–15.2)

## 2021-05-13 LAB — SEDIMENTATION RATE: Sed Rate: 16 mm/hr (ref 0–30)

## 2021-05-13 NOTE — Patient Instructions (Signed)
Your procedure is scheduled on: Friday 05/22/21 Report to [facility]. To find out your arrival time please call [phone] between 1PM - 3PM on Thursday 05/21/21.  Remember: Instructions that are not followed completely may result in serious medical risk, up to and including death, or upon the discretion of your surgeon and anesthesiologist your surgery may need to be rescheduled.     _X__ 1. Do not eat food after midnight the night before your procedure.                 No gum chewing or hard candies. You may drink clear liquids up to 2 hours                 before you are scheduled to arrive for your surgery- DO not drink clear                 liquids within 2 hours of the start of your surgery.                 Clear Liquids include:  water, apple juice without pulp, clear carbohydrate                 drink such as Clearfast or Gatorade, Black Coffee or Tea (Do not add                 anything to coffee or tea). Diabetics water only  __X__2.  On the morning of surgery brush your teeth with toothpaste and water, you                 may rinse your mouth with mouthwash if you wish.  Do not swallow any              toothpaste of mouthwash.     _X__ 3.  No Alcohol for 24 hours before or after surgery.   _X__ 4.  Do Not Smoke or use e-cigarettes For 24 Hours Prior to Your Surgery.                 Do not use any chewable tobacco products for at least 6 hours prior to                 surgery.  ____  5.  Bring all medications with you on the day of surgery if instructed.   __X__  6.  Notify your doctor if there is any change in your medical condition      (cold, fever, infections).     Do not wear jewelry, make-up, hairpins, clips or nail polish. Do not wear lotions, powders, or perfumes.  Do not shave body hair 48 hours prior to surgery. Men may shave face and neck. Do not bring valuables to the hospital.    [facility] is not  responsible for any belongings or valuables.  Contacts, dentures/partials or body piercings may not be worn into surgery. Bring a case for your contacts, glasses or hearing aids, a denture cup will be supplied. Leave your suitcase in the car. After surgery it may be brought to your room. For patients admitted to the hospital, discharge time is determined by your treatment team.   Patients discharged the day of surgery will not be allowed to drive home.   Please read over the following fact sheets that you were given:   MRSA Information, CHG SOAP, INCENTIVE SPIROMETER  __X__ Take these medicines the morning of  surgery with A SIP OF WATER:    1. OMEPRAZOLE  2.   3.   4.  5.  6.  ____ Fleet Enema (as directed)   __X__ Use CHG Soap/SAGE wipes as directed  ____ Use inhalers on the day of surgery  ____ Stop metformin/Janumet/Farxiga 2 days prior to surgery    ____ Take 1/2 of usual insulin dose the night before surgery. No insulin the morning          of surgery.   ____ Stop Blood Thinners Coumadin/Plavix/Xarelto/Pleta/Pradaxa/Eliquis/Effient/Aspirin  on   Or contact your Surgeon, Cardiologist or Medical Doctor regarding  ability to stop your blood thinners  __X__ Stop Anti-inflammatories 7 days before surgery such as Advil, Ibuprofen, Motrin,  BC or Goodies Powder, Naprosyn, Naproxen, Aleve, Aspirin    __X__ Stop all herbal supplements, fish oil or vitamin E until after surgery.    ____ Bring C-Pap to the hospital.

## 2021-05-14 LAB — URINE CULTURE
Culture: <10000 — AB
Special Requests: NORMAL

## 2021-05-14 LAB — HEMOGLOBIN A1C
Hgb A1c MFr Bld: 5.7 % — ABNORMAL HIGH (ref 4.8–5.6)
Mean Plasma Glucose: 117 mg/dL

## 2021-05-17 NOTE — H&P (Signed)
ORTHOPAEDIC HISTORY & PHYSICAL Gwenlyn Fudge, [place] - 05/12/2021 2:45 PM EDT Formatting of this note is different from the original. [place] [facility] Chief Complaint:   Chief Complaint  Patient presents with   Knee Pain  H & P RIGHT KNEE   History of Present Illness:   [name] is a 65 y.o. female that presents to clinic today for her preoperative history and evaluation. Patient presents unaccompanied. The patient is scheduled to undergo a right total knee arthroplasty on 05/22/21 by Dr. Marry Guan. Her pain began several years ago. Patient previously underwent a right knee arthroscopy with partial lateral meniscectomy in November 2020 with Dr. Marry Guan. She was noted to have grade 4 changes of chondromalacia at that time. The pain is located primarily along the medial aspect of the knee. She describes her pain as it is worse with weightbearing. She denies associated numbness or tingling, denies swelling, locking, or giving way of the knee.   The patient's symptoms have progressed to the point that they decrease her quality of life. The patient has previously undergone conservative treatment including NSAIDS and injections to the knee without adequate control of her symptoms.  Denies significant cardiac history, history of blood clots, or history of lumbar surgery. No significant drug allergies. She has her 2 sisters that will be staying with her after surgery when she returns home from the hospital.   Past Medical, Surgical, Family, Social History, Allergies, Medications:   Past Medical History:  Past Medical History:  Diagnosis Date   Anemia   Anxiety   Chicken pox   COPD (chronic obstructive pulmonary disease) (CMS-HCC)   Depression   Diabetes mellitus without complication (CMS-HCC)   GERD (gastroesophageal reflux disease)   High cholesterol   History of cancer   Hypertension   Kidney infection   Kidney stones   Migraines    Reflux esophagitis   Seizures (CMS-HCC)   Thyroid disease   Past Surgical History:  Past Surgical History:  Procedure Laterality Date   Right knee arthroscopy, partial lateral meniscectomy, and chondroplasty 10/08/2019  Dr Marry Guan   APPENDECTOMY   eptopic preg   HYSTERECTOMY VAGINAL   TONSILLECTOMY   Current Medications:  Current Outpatient Medications  Medication Sig Dispense Refill   cholecalciferol (VITAMIN D3) 1000 unit capsule Take 1 capsule by mouth 2 (two) times daily   collagen, hydrolysate, bovine, (COLLAGEN, HYDR, BOVINE,, BULK,) 100 % Powd Use 1 Tbsp nightly   dextrin (FIBER, DEXTRIN,) 3 gram/3.5 gram Powd Take 1 packet by mouth once daily   fexofenadine (ALLEGRA) 180 MG tablet Take 1 tablet (180 mg total) by mouth once daily 30 tablet 3   frovatriptan (FROVA) 2.5 MG tablet Take 2.5 mg by mouth once daily as needed On set migraine   hydrocortisone 2.5 % cream APPLY TO SCALY AREA AROUND THE NOSE TWICE DAILY UP TO ONE WEEK.   loratadine (CLARITIN) 10 mg tablet Take 10 mg by mouth once daily   multivit with minerals/lutein (MULTIVITAMIN 50 PLUS ORAL) Take 1 Scoop by mouth once daily MCT WELLNESS   naproxen sodium (ALEVE) 220 MG tablet Take 220 mg by mouth 2 (two) times daily as needed   nitrofurantoin, macrocrystal-monohydrate, (MACROBID) 100 MG capsule Take 1 tablet within 2 hours of intercourse (Patient taking differently: Take 100 mg by mouth as directed Take 1 tablet within 2 hours of intercourse) 10 capsule 0   omega-3/dha/epa/dpa/fish oil (OMEGA-3 2100 ORAL) Take 2 tablets by mouth 2 (two) times  daily   omeprazole (PRILOSEC) 20 MG DR capsule Take 1 capsule (20 mg total) by mouth once daily 90 capsule 1   QUERCETIN DIHYDRATE, BULK, MISC Take 2 capsules by mouth 2 (two) times daily   No current facility-administered medications for this visit.   Allergies:  Allergies  Allergen Reactions   Meloxicam Other (See Comments)  Hematuria with large doses   Social History:   Social History   Socioeconomic History   Marital status: Widowed   Number of children: 1   Years of education: 12   Highest education level: High school graduate  Occupational History   Occupation: Retired- Part-time Seasonal tax work, Restaurant manager, fast food  Tobacco Use   Smoking status: Never Smoker   Smokeless tobacco: Never Used  Scientific laboratory technician Use: Never used  Substance and Sexual Activity   Alcohol use: Not Currently   Drug use: Never   Sexual activity: Defer  Partners: Male   Family History:  Family History  Problem Relation Age of Onset   Stroke Mother   High blood pressure (Hypertension) Mother   Diabetes type II Mother   Melanoma Father   Diabetes type II Father   High blood pressure (Hypertension) Father   Breast cancer Sister   No Known Problems Sister   Review of Systems:   A 10+ ROS was performed, reviewed, and the pertinent orthopaedic findings are documented in the HPI.   Physical Examination:   BP 122/74 (BP Location: Left upper arm, Patient Position: Sitting, BP Cuff Size: Large Adult)  Ht 167.6 cm (5\' 6" )  Wt (!) 104.5 kg (230 lb 6.4 oz)  BMI 37.19 kg/m   Patient is a well-developed, well-nourished female in no acute distress. Patient has normal mood and affect. Patient is alert and oriented to person, place, and time.   HEENT: Atraumatic, normocephalic. Pupils equal and reactive to light. Extraocular motion intact. Noninjected sclera.  Cardiovascular: Regular rate and rhythm, with no murmurs, rubs, or gallops. Distal pulses palpable.  Respiratory: Lungs clear to auscultation bilaterally.   Right Knee: Soft tissue swelling: none Effusion: none Erythema: none Crepitance: mild Tenderness: medial Alignment: relative varus Mediolateral laxity: medial pseudolaxity Posterior sag: negative Patellar tracking: Good tracking without evidence of subluxation or tilt Atrophy: No significant atrophy.  Quadriceps tone was good. Range of motion: 0/0/134  degrees  Sensation intact over the saphenous, lateral sural cutaneous, superficial fibular, and deep fibular nerve distributions.  Tests Performed/Reviewed:  X-rays  3 views of the right knee were reviewed. Images reveal severe loss of medial compartment joint space with mild osteophyte formation. No fractures or dislocations. No osseous abnormality noted.  Impression:   ICD-10-CM  1. Primary osteoarthritis of right knee M17.11   Plan:   The patient has end-stage degenerative changes of the right knee. It was explained to the patient that the condition is progressive in nature. Having failed conservative treatment, the patient has elected to proceed with a total joint arthroplasty. The patient will undergo a total joint arthroplasty with Dr. Marry Guan. The risks of surgery, including blood clot and infection, were discussed with the patient. Measures to reduce these risks, including the use of anticoagulation, perioperative antibiotics, and early ambulation were discussed. The importance of postoperative physical therapy was discussed with the patient. The patient elects to proceed with surgery. The patient is instructed to stop all blood thinners prior to surgery. The patient is instructed to call the hospital the day before surgery to learn of the proper arrival time.  Contact our office with any questions or concerns. Follow up as indicated, or sooner should any new problems arise, if conditions worsen, or if they are otherwise concerned.   Gwenlyn Fudge, [facility] [place], [place] [address] Phone: [phone]  This note was generated in part with voice recognition software and I apologize for any typographical errors that were not detected and corrected.  Electronically signed by Gwenlyn Fudge, PA at 05/17/2021 12:51 PM EDT

## 2021-05-20 ENCOUNTER — Other Ambulatory Visit: Payer: Self-pay

## 2021-05-20 ENCOUNTER — Other Ambulatory Visit
Admission: RE | Admit: 2021-05-20 | Discharge: 2021-05-20 | Disposition: A | Discharge status: Home / Self Care | Payer: BC Managed Care – PPO | Source: Ambulatory Visit | Attending: Orthopedic Surgery | Admitting: Orthopedic Surgery

## 2021-05-20 DIAGNOSIS — Z01812 Encounter for preprocedural laboratory examination: Secondary | ICD-10-CM | POA: Insufficient documentation

## 2021-05-20 DIAGNOSIS — U071 COVID-19: Secondary | ICD-10-CM | POA: Insufficient documentation

## 2021-05-20 LAB — SARS CORONAVIRUS 2 (TAT 6-24 HRS): SARS Coronavirus 2: POSITIVE — AB

## 2021-05-21 ENCOUNTER — Other Ambulatory Visit
Admission: RE | Admit: 2021-05-21 | Discharge: 2021-05-21 | Disposition: A | Discharge status: Home / Self Care | Payer: BC Managed Care – PPO | Source: Ambulatory Visit | Attending: Orthopedic Surgery | Admitting: Orthopedic Surgery

## 2021-05-21 DIAGNOSIS — U071 COVID-19: Secondary | ICD-10-CM | POA: Insufficient documentation

## 2021-05-21 DIAGNOSIS — Z01812 Encounter for preprocedural laboratory examination: Secondary | ICD-10-CM | POA: Diagnosis present

## 2021-05-21 LAB — SARS CORONAVIRUS 2 BY RT PCR (HOSPITAL ORDER, PERFORMED IN ~~LOC~~ HOSPITAL LAB): SARS Coronavirus 2: POSITIVE — AB

## 2021-06-28 NOTE — H&P (Signed)
ORTHOPAEDIC HISTORY & PHYSICAL Gwenlyn Fudge, [place] - 06/25/2021 11:00 AM EDT Formatting of this note is different from the original. [place] [facility] Chief Complaint:   Chief Complaint  Patient presents with   Knee Pain  H & P RIGHT KNEE   History of Present Illness:    [name] is a 65 y.o. female that presents to clinic today for her preoperative history and evaluation. Patient presents unaccompanied. The patient is scheduled to undergo a right total knee arthroplasty on 05/22/21 by Dr. Marry Guan. Her pain began several years ago. Patient previously underwent a right knee arthroscopy with partial lateral meniscectomy in November 2020 with Dr. Marry Guan. She was noted to have grade 4 changes of chondromalacia at that time. The pain is located primarily along the medial aspect of the knee. She describes her pain as it is worse with weightbearing. She denies associated numbness or tingling, denies swelling, locking, or giving way of the knee.    The patient's symptoms have progressed to the point that they decrease her quality of life. The patient has previously undergone conservative treatment including NSAIDS and injections to the knee without adequate control of her symptoms.   Denies significant cardiac history, history of blood clots, or history of lumbar surgery. No significant drug allergies. She has her 2 sisters that will be staying with her after surgery when she returns home from the hospital.   Past Medical, Surgical, Family, Social History, Allergies, Medications:   Past Medical History:  Past Medical History:  Diagnosis Date   Anemia   Anxiety   Chicken pox   COPD (chronic obstructive pulmonary disease) (CMS-HCC)   Depression   Diabetes mellitus without complication (CMS-HCC)   GERD (gastroesophageal reflux disease)   High cholesterol   History of cancer   Hypertension   Kidney infection   Kidney stones   Migraines    Reflux esophagitis   Seizures (CMS-HCC)   Thyroid disease   Past Surgical History:  Past Surgical History:  Procedure Laterality Date   Right knee arthroscopy, partial lateral meniscectomy, and chondroplasty 10/08/2019  Dr Marry Guan   APPENDECTOMY   eptopic preg   HYSTERECTOMY VAGINAL   TONSILLECTOMY   Current Medications:  Current Outpatient Medications  Medication Sig Dispense Refill   cholecalciferol (VITAMIN D3) 1000 unit capsule Take 1 capsule by mouth 2 (two) times daily   dextrin (FIBER, DEXTRIN,) 3 gram/3.5 gram Powd Take 1 packet by mouth once daily   frovatriptan (FROVA) 2.5 MG tablet Take 2.5 mg by mouth once daily as needed On set migraine   loratadine (CLARITIN) 10 mg tablet Take 10 mg by mouth once daily   naproxen sodium (ALEVE) 220 MG tablet Take 220 mg by mouth 2 (two) times daily as needed   omega-3/dha/epa/dpa/fish oil (OMEGA-3 2100 ORAL) Take 2 tablets by mouth 2 (two) times daily   omeprazole (PRILOSEC) 20 MG DR capsule Take 1 capsule (20 mg total) by mouth once daily 90 capsule 1   QUERCETIN DIHYDRATE, BULK, MISC Take 2 capsules by mouth 2 (two) times daily   No current facility-administered medications for this visit.   Allergies:  Allergies  Allergen Reactions   Meloxicam Other (See Comments)  Hematuria with large doses   Social History:  Social History   Socioeconomic History   Marital status: Widowed   Number of children: 1   Years of education: 12   Highest education level: High school graduate  Occupational History   Occupation:  Retired- Part-time Seasonal tax work, Restaurant manager, fast food  Tobacco Use   Smoking status: Never Smoker   Smokeless tobacco: Never Used  Scientific laboratory technician Use: Never used  Substance and Sexual Activity   Alcohol use: Not Currently   Drug use: Never   Sexual activity: Defer  Partners: Male   Family History:  Family History  Problem Relation Age of Onset   Stroke Mother   High blood pressure (Hypertension) Mother    Diabetes type II Mother   Melanoma Father   Diabetes type II Father   High blood pressure (Hypertension) Father   Breast cancer Sister   No Known Problems Sister   Review of Systems:   A 10+ ROS was performed, reviewed, and the pertinent orthopaedic findings are documented in the HPI.   Physical Examination:   BP 130/88 (BP Location: Left upper arm, Patient Position: Sitting, BP Cuff Size: Large Adult)  Ht 167.6 cm ('5\' 6"'$ )  Wt (!) 104.1 kg (229 lb 6.4 oz)  BMI 37.03 kg/m   Patient is a well-developed, well-nourished female in no acute distress. Patient has normal mood and affect. Patient is alert and oriented to person, place, and time.   HEENT: Atraumatic, normocephalic. Pupils equal and reactive to light. Extraocular motion intact. Noninjected sclera.  Cardiovascular: Regular rate and rhythm, with no murmurs, rubs, or gallops. Distal pulses palpable.  Respiratory: Lungs clear to auscultation bilaterally.     Right  Knee:    Soft tissue swelling: none    Effusion:                   none    Erythema:                 none    Crepitance:               mild    Tenderness:             medial    Alignment:                relative varus    Mediolateral laxity:   medial pseudolaxity    Posterior sag:           negative    Patellar tracking:      Good tracking without evidence of subluxation or tilt    Atrophy:                    No significant atrophy.                                       Quadriceps tone was good.    Range of motion:     0/0/134 degrees  Sensation intact over the saphenous, lateral sural cutaneous, superficial fibular, and deep fibular nerve distributions.  Tests Performed/Reviewed:  X-rays  3 views of the right knee were obtained. Images reveal severe loss of medial compartment joint space with significant osteophyte formation. No fractures or dislocations. No osseous abnormality noted.  Impression:   ICD-10-CM  1. S/P arthroscopy of right knee  Z98.890  2. Primary osteoarthritis of right knee M17.11  3. Severe obesity (BMI 35.0-39.9) with comorbidity (CMS-HCC) E66.01   Plan:   The patient has end-stage degenerative changes of the right knee. It was explained to the patient that the condition is progressive in nature. Having failed conservative treatment, the patient has elected  to proceed with a total joint arthroplasty. The patient will undergo a total joint arthroplasty with Dr. Marry Guan. The risks of surgery, including blood clot and infection, were discussed with the patient. Measures to reduce these risks, including the use of anticoagulation, perioperative antibiotics, and early ambulation were discussed. The importance of postoperative physical therapy was discussed with the patient. The patient elects to proceed with surgery. The patient is instructed to stop all blood thinners prior to surgery. The patient is instructed to call the hospital the day before surgery to learn of the proper arrival time.   Contact our office with any questions or concerns. Follow up as indicated, or sooner should any new problems arise, if conditions worsen, or if they are otherwise concerned.   Gwenlyn Fudge, [facility] [place], [place] [address] Phone: [phone]  This note was generated in part with voice recognition software and I apologize for any typographical errors that were not detected and corrected.  Electronically signed by Gwenlyn Fudge, PA at 06/25/2021 11:22 AM EDT

## 2021-07-02 MED ORDER — CHLORHEXIDINE GLUCONATE 0.12 % MT SOLN: 15.0000 mL | Freq: Once | OROMUCOSAL | Status: AC

## 2021-07-02 MED ORDER — DEXAMETHASONE SODIUM PHOSPHATE 10 MG/ML IJ SOLN: 8.0000 mg | Freq: Once | INTRAMUSCULAR | Status: AC

## 2021-07-02 MED ORDER — CEFAZOLIN SODIUM-DEXTROSE 2-4 GM/100ML-% IV SOLN
2.0000 g | INTRAVENOUS | Status: AC
  Administered 2021-07-03: 2 g via INTRAVENOUS

## 2021-07-02 MED ORDER — GABAPENTIN 300 MG PO CAPS: 300.0000 mg | ORAL_CAPSULE | Freq: Once | ORAL | Status: AC

## 2021-07-02 MED ORDER — CELECOXIB 200 MG PO CAPS: 400.0000 mg | ORAL_CAPSULE | Freq: Once | ORAL | Status: AC

## 2021-07-02 MED ORDER — TRANEXAMIC ACID-NACL 1000-0.7 MG/100ML-% IV SOLN
1000.0000 mg | INTRAVENOUS | Status: AC
  Administered 2021-07-03: 1000 mg via INTRAVENOUS

## 2021-07-02 MED ORDER — ORAL CARE MOUTH RINSE: 15.0000 mL | Freq: Once | OROMUCOSAL | Status: AC

## 2021-07-02 MED ORDER — CHLORHEXIDINE GLUCONATE 4 % EX LIQD: 60.0000 mL | Freq: Once | CUTANEOUS | Status: DC

## 2021-07-02 MED ORDER — LACTATED RINGERS IV SOLN: INTRAVENOUS | Status: DC

## 2021-07-03 ENCOUNTER — Observation Stay
Admission: RE | Admit: 2021-07-03 | Discharge: 2021-07-04 | Disposition: A | Discharge status: Home / Self Care | Payer: Medicare Other | Attending: Orthopedic Surgery | Admitting: Orthopedic Surgery

## 2021-07-03 ENCOUNTER — Encounter
Admission: RE | Disposition: A | Discharge status: Home / Self Care | Payer: Self-pay | Source: Home / Self Care | Attending: Orthopedic Surgery

## 2021-07-03 ENCOUNTER — Encounter: Payer: Self-pay | Admitting: Orthopedic Surgery

## 2021-07-03 ENCOUNTER — Inpatient Hospital Stay: Payer: Medicare Other | Admitting: Urgent Care

## 2021-07-03 ENCOUNTER — Other Ambulatory Visit: Payer: Self-pay

## 2021-07-03 ENCOUNTER — Inpatient Hospital Stay: Payer: Medicare Other

## 2021-07-03 DIAGNOSIS — E119 Type 2 diabetes mellitus without complications: Secondary | ICD-10-CM | POA: Insufficient documentation

## 2021-07-03 DIAGNOSIS — I10 Essential (primary) hypertension: Secondary | ICD-10-CM | POA: Insufficient documentation

## 2021-07-03 DIAGNOSIS — Z96659 Presence of unspecified artificial knee joint: Secondary | ICD-10-CM

## 2021-07-03 DIAGNOSIS — M79622 Pain in left upper arm: Secondary | ICD-10-CM | POA: Insufficient documentation

## 2021-07-03 DIAGNOSIS — J449 Chronic obstructive pulmonary disease, unspecified: Secondary | ICD-10-CM | POA: Insufficient documentation

## 2021-07-03 DIAGNOSIS — Z79899 Other long term (current) drug therapy: Secondary | ICD-10-CM | POA: Insufficient documentation

## 2021-07-03 DIAGNOSIS — M1711 Unilateral primary osteoarthritis, right knee: Principal | ICD-10-CM | POA: Insufficient documentation

## 2021-07-03 DIAGNOSIS — Z859 Personal history of malignant neoplasm, unspecified: Secondary | ICD-10-CM | POA: Diagnosis not present

## 2021-07-03 DIAGNOSIS — L719 Rosacea, unspecified: Secondary | ICD-10-CM

## 2021-07-03 DIAGNOSIS — Z96651 Presence of right artificial knee joint: Secondary | ICD-10-CM

## 2021-07-03 HISTORY — PX: KNEE ARTHROPLASTY: SHX992

## 2021-07-03 LAB — TYPE AND SCREEN
ABO/RH(D): O POS
Antibody Screen: NEGATIVE

## 2021-07-03 LAB — PROTIME-INR
INR: 1.1 (ref 0.8–1.2)
Prothrombin Time: 13.8 seconds (ref 11.4–15.2)

## 2021-07-03 LAB — APTT: aPTT: 27 seconds (ref 24–36)

## 2021-07-03 SURGERY — ARTHROPLASTY, KNEE, TOTAL, USING IMAGELESS COMPUTER-ASSISTED NAVIGATION
Anesthesia: Spinal | Site: Knee | Laterality: Right

## 2021-07-03 MED ORDER — DROPERIDOL 2.5 MG/ML IJ SOLN
0.6250 mg | Freq: Once | INTRAMUSCULAR | Status: DC | PRN
  Filled 2021-07-03: qty 2

## 2021-07-03 MED ORDER — ONDANSETRON HCL 4 MG/2ML IJ SOLN: 4.0000 mg | Freq: Four times a day (QID) | INTRAMUSCULAR | Status: DC | PRN

## 2021-07-03 MED ORDER — OXYCODONE HCL 5 MG/5ML PO SOLN: 5.0000 mg | Freq: Once | ORAL | Status: DC | PRN

## 2021-07-03 MED ORDER — PHENOL 1.4 % MT LIQD
1.0000 | OROMUCOSAL | Status: DC | PRN
  Filled 2021-07-03: qty 177

## 2021-07-03 MED ORDER — PROMETHAZINE HCL 25 MG/ML IJ SOLN
6.2500 mg | INTRAMUSCULAR | Status: DC | PRN
Start: 2021-07-03 — End: 2021-07-03

## 2021-07-03 MED ORDER — SODIUM CHLORIDE 0.9 % IR SOLN
Status: DC | PRN
  Administered 2021-07-03: 3012 mL

## 2021-07-03 MED ORDER — NEOMYCIN-POLYMYXIN B GU 40-200000 IR SOLN
Status: AC
  Filled 2021-07-03: qty 20

## 2021-07-03 MED ORDER — DIPHENHYDRAMINE HCL 12.5 MG/5ML PO ELIX: 12.5000 mg | ORAL_SOLUTION | ORAL | Status: DC | PRN

## 2021-07-03 MED ORDER — EPHEDRINE 5 MG/ML INJ
INTRAVENOUS | Status: AC
  Filled 2021-07-03: qty 5

## 2021-07-03 MED ORDER — MIDAZOLAM HCL 5 MG/5ML IJ SOLN
INTRAMUSCULAR | Status: DC | PRN
  Administered 2021-07-03: 2 mg via INTRAVENOUS

## 2021-07-03 MED ORDER — SODIUM CHLORIDE 0.9 % IR SOLN
Status: DC | PRN
  Administered 2021-07-03: 1004 mL

## 2021-07-03 MED ORDER — CEFAZOLIN SODIUM-DEXTROSE 2-4 GM/100ML-% IV SOLN
2.0000 g | Freq: Four times a day (QID) | INTRAVENOUS | Status: AC
  Administered 2021-07-03: 2 g via INTRAVENOUS
  Filled 2021-07-03 (×2): qty 100

## 2021-07-03 MED ORDER — METOCLOPRAMIDE HCL 10 MG PO TABS
10.0000 mg | ORAL_TABLET | Freq: Three times a day (TID) | ORAL | Status: DC
  Administered 2021-07-03 – 2021-07-04 (×3): 10 mg via ORAL
  Filled 2021-07-03 (×3): qty 1

## 2021-07-03 MED ORDER — MAGNESIUM HYDROXIDE 400 MG/5ML PO SUSP
30.0000 mL | Freq: Every day | ORAL | Status: DC
  Administered 2021-07-04: 30 mL via ORAL
  Filled 2021-07-03: qty 30

## 2021-07-03 MED ORDER — TRAMADOL HCL 50 MG PO TABS
50.0000 mg | ORAL_TABLET | ORAL | Status: DC | PRN
  Administered 2021-07-03: 100 mg via ORAL
  Filled 2021-07-03: qty 2

## 2021-07-03 MED ORDER — PROPOFOL 500 MG/50ML IV EMUL
INTRAVENOUS | Status: DC | PRN
  Administered 2021-07-03: 50 ug/kg/min via INTRAVENOUS

## 2021-07-03 MED ORDER — CELECOXIB 200 MG PO CAPS
200.0000 mg | ORAL_CAPSULE | Freq: Two times a day (BID) | ORAL | Status: DC
  Administered 2021-07-03 – 2021-07-04 (×2): 200 mg via ORAL
  Filled 2021-07-03 (×2): qty 1

## 2021-07-03 MED ORDER — MIDAZOLAM HCL 2 MG/2ML IJ SOLN
INTRAMUSCULAR | Status: AC
  Filled 2021-07-03: qty 2

## 2021-07-03 MED ORDER — PROPOFOL 1000 MG/100ML IV EMUL
INTRAVENOUS | Status: AC
  Filled 2021-07-03: qty 100

## 2021-07-03 MED ORDER — FERROUS SULFATE 325 (65 FE) MG PO TABS
325.0000 mg | ORAL_TABLET | Freq: Two times a day (BID) | ORAL | Status: DC
  Administered 2021-07-03 – 2021-07-04 (×2): 325 mg via ORAL
  Filled 2021-07-03 (×2): qty 1

## 2021-07-03 MED ORDER — CELECOXIB 200 MG PO CAPS
ORAL_CAPSULE | ORAL | Status: AC
  Administered 2021-07-03: 400 mg via ORAL
  Filled 2021-07-03: qty 2

## 2021-07-03 MED ORDER — CEFAZOLIN SODIUM-DEXTROSE 2-4 GM/100ML-% IV SOLN
INTRAVENOUS | Status: AC
  Administered 2021-07-04: 2 g via INTRAVENOUS
  Filled 2021-07-03: qty 100

## 2021-07-03 MED ORDER — OXYCODONE HCL 5 MG PO TABS
5.0000 mg | ORAL_TABLET | ORAL | Status: DC | PRN
  Administered 2021-07-03 – 2021-07-04 (×4): 5 mg via ORAL
  Filled 2021-07-03 (×2): qty 1

## 2021-07-03 MED ORDER — DEXAMETHASONE SODIUM PHOSPHATE 10 MG/ML IJ SOLN
INTRAMUSCULAR | Status: AC
  Administered 2021-07-03: 8 mg via INTRAVENOUS
  Filled 2021-07-03: qty 1

## 2021-07-03 MED ORDER — OXYCODONE HCL 5 MG PO TABS
5.0000 mg | ORAL_TABLET | Freq: Once | ORAL | Status: DC | PRN
Start: 2021-07-03 — End: 2021-07-03

## 2021-07-03 MED ORDER — BUPIVACAINE HCL (PF) 0.25 % IJ SOLN
INTRAMUSCULAR | Status: AC
  Filled 2021-07-03: qty 60

## 2021-07-03 MED ORDER — ACETAMINOPHEN 10 MG/ML IV SOLN
INTRAVENOUS | Status: AC
  Filled 2021-07-03: qty 100

## 2021-07-03 MED ORDER — BUPIVACAINE HCL (PF) 0.5 % IJ SOLN
INTRAMUSCULAR | Status: DC | PRN
  Administered 2021-07-03: 3 mL

## 2021-07-03 MED ORDER — ALUM & MAG HYDROXIDE-SIMETH 200-200-20 MG/5ML PO SUSP: 30.0000 mL | ORAL | Status: DC | PRN

## 2021-07-03 MED ORDER — FLEET ENEMA 7-19 GM/118ML RE ENEM: 1.0000 | ENEMA | Freq: Once | RECTAL | Status: DC | PRN

## 2021-07-03 MED ORDER — DEXMEDETOMIDINE (PRECEDEX) IN NS 20 MCG/5ML (4 MCG/ML) IV SYRINGE
PREFILLED_SYRINGE | INTRAVENOUS | Status: DC | PRN
  Administered 2021-07-03: 8 ug via INTRAVENOUS

## 2021-07-03 MED ORDER — SODIUM CHLORIDE 0.9 % IV SOLN
INTRAVENOUS | Status: DC | PRN
  Administered 2021-07-03: 50 ug/min via INTRAVENOUS

## 2021-07-03 MED ORDER — BUPIVACAINE LIPOSOME 1.3 % IJ SUSP
INTRAMUSCULAR | Status: AC
  Filled 2021-07-03: qty 20

## 2021-07-03 MED ORDER — MENTHOL 3 MG MT LOZG
1.0000 | LOZENGE | OROMUCOSAL | Status: DC | PRN
  Filled 2021-07-03: qty 9

## 2021-07-03 MED ORDER — SURGIPHOR WOUND IRRIGATION SYSTEM - OPTIME
TOPICAL | Status: DC | PRN
  Administered 2021-07-03: 1

## 2021-07-03 MED ORDER — ONDANSETRON HCL 4 MG PO TABS: 4.0000 mg | ORAL_TABLET | Freq: Four times a day (QID) | ORAL | Status: DC | PRN

## 2021-07-03 MED ORDER — BUPIVACAINE HCL (PF) 0.5 % IJ SOLN
INTRAMUSCULAR | Status: AC
  Filled 2021-07-03: qty 10

## 2021-07-03 MED ORDER — ACETAMINOPHEN 10 MG/ML IV SOLN: 1000.0000 mg | Freq: Once | INTRAVENOUS | Status: DC | PRN

## 2021-07-03 MED ORDER — TRANEXAMIC ACID-NACL 1000-0.7 MG/100ML-% IV SOLN
INTRAVENOUS | Status: AC
  Filled 2021-07-03: qty 100

## 2021-07-03 MED ORDER — GABAPENTIN 300 MG PO CAPS
ORAL_CAPSULE | ORAL | Status: AC
  Administered 2021-07-03: 300 mg via ORAL
  Filled 2021-07-03: qty 1

## 2021-07-03 MED ORDER — ACETAMINOPHEN 10 MG/ML IV SOLN
INTRAVENOUS | Status: DC | PRN
  Administered 2021-07-03: 1000 mg via INTRAVENOUS

## 2021-07-03 MED ORDER — FENTANYL CITRATE (PF) 100 MCG/2ML IJ SOLN: 25.0000 ug | INTRAMUSCULAR | Status: DC | PRN

## 2021-07-03 MED ORDER — BISACODYL 10 MG RE SUPP: 10.0000 mg | Freq: Every day | RECTAL | Status: DC | PRN

## 2021-07-03 MED ORDER — SODIUM CHLORIDE 0.9 % IV SOLN: INTRAVENOUS | Status: DC

## 2021-07-03 MED ORDER — LIDOCAINE HCL (CARDIAC) PF 100 MG/5ML IV SOSY
PREFILLED_SYRINGE | INTRAVENOUS | Status: DC | PRN
  Administered 2021-07-03: 60 mg via INTRAVENOUS

## 2021-07-03 MED ORDER — TRANEXAMIC ACID-NACL 1000-0.7 MG/100ML-% IV SOLN: 1000.0000 mg | Freq: Once | INTRAVENOUS | Status: AC

## 2021-07-03 MED ORDER — GLYCOPYRROLATE 0.2 MG/ML IJ SOLN
INTRAMUSCULAR | Status: DC | PRN
  Administered 2021-07-03: .2 mg via INTRAVENOUS

## 2021-07-03 MED ORDER — ENOXAPARIN SODIUM 30 MG/0.3ML IJ SOSY
30.0000 mg | PREFILLED_SYRINGE | Freq: Two times a day (BID) | INTRAMUSCULAR | Status: DC
  Administered 2021-07-04: 30 mg via SUBCUTANEOUS
  Filled 2021-07-03: qty 0.3

## 2021-07-03 MED ORDER — SENNOSIDES-DOCUSATE SODIUM 8.6-50 MG PO TABS
1.0000 | ORAL_TABLET | Freq: Two times a day (BID) | ORAL | Status: DC
  Administered 2021-07-03 – 2021-07-04 (×2): 1 via ORAL
  Filled 2021-07-03 (×2): qty 1

## 2021-07-03 MED ORDER — ACETAMINOPHEN 325 MG PO TABS: 325.0000 mg | ORAL_TABLET | Freq: Four times a day (QID) | ORAL | Status: DC | PRN

## 2021-07-03 MED ORDER — FENTANYL CITRATE (PF) 100 MCG/2ML IJ SOLN
INTRAMUSCULAR | Status: AC
  Filled 2021-07-03: qty 2

## 2021-07-03 MED ORDER — SODIUM CHLORIDE FLUSH 0.9 % IV SOLN
INTRAVENOUS | Status: AC
  Filled 2021-07-03: qty 40

## 2021-07-03 MED ORDER — GLYCOPYRROLATE 0.2 MG/ML IJ SOLN
INTRAMUSCULAR | Status: AC
  Filled 2021-07-03: qty 1

## 2021-07-03 MED ORDER — TRANEXAMIC ACID-NACL 1000-0.7 MG/100ML-% IV SOLN
INTRAVENOUS | Status: AC
  Administered 2021-07-03: 1000 mg via INTRAVENOUS
  Filled 2021-07-03: qty 100

## 2021-07-03 MED ORDER — PANTOPRAZOLE SODIUM 40 MG PO TBEC
40.0000 mg | DELAYED_RELEASE_TABLET | Freq: Two times a day (BID) | ORAL | Status: DC
  Administered 2021-07-03 – 2021-07-04 (×2): 40 mg via ORAL
  Filled 2021-07-03 (×2): qty 1

## 2021-07-03 MED ORDER — MEPERIDINE HCL 25 MG/ML IJ SOLN: 6.2500 mg | INTRAMUSCULAR | Status: DC | PRN

## 2021-07-03 MED ORDER — PROPOFOL 10 MG/ML IV BOLUS
INTRAVENOUS | Status: DC | PRN
  Administered 2021-07-03: 30 mg via INTRAVENOUS
  Administered 2021-07-03: 20 mg via INTRAVENOUS

## 2021-07-03 MED ORDER — EPHEDRINE SULFATE 50 MG/ML IJ SOLN
INTRAMUSCULAR | Status: DC | PRN
  Administered 2021-07-03 (×2): 5 mg via INTRAVENOUS
  Administered 2021-07-03: 10 mg via INTRAVENOUS
  Administered 2021-07-03: 5 mg via INTRAVENOUS

## 2021-07-03 MED ORDER — SODIUM CHLORIDE 0.9 % IR SOLN: Status: DC | PRN

## 2021-07-03 MED ORDER — OXYCODONE HCL 5 MG PO TABS
10.0000 mg | ORAL_TABLET | ORAL | Status: DC | PRN
  Filled 2021-07-03 (×3): qty 2

## 2021-07-03 MED ORDER — SODIUM CHLORIDE (PF) 0.9 % IJ SOLN
INTRAMUSCULAR | Status: DC | PRN
  Administered 2021-07-03: 120 mL

## 2021-07-03 MED ORDER — ACETAMINOPHEN 10 MG/ML IV SOLN
1000.0000 mg | Freq: Four times a day (QID) | INTRAVENOUS | Status: DC
  Administered 2021-07-03 – 2021-07-04 (×3): 1000 mg via INTRAVENOUS
  Filled 2021-07-03 (×3): qty 100

## 2021-07-03 MED ORDER — HYDROMORPHONE HCL 1 MG/ML IJ SOLN: 0.5000 mg | INTRAMUSCULAR | Status: DC | PRN

## 2021-07-03 MED ORDER — CHLORHEXIDINE GLUCONATE 0.12 % MT SOLN
OROMUCOSAL | Status: AC
  Administered 2021-07-03: 15 mL via OROMUCOSAL
  Filled 2021-07-03: qty 15

## 2021-07-03 SURGICAL SUPPLY — 73 items
ATTUNE MED DOME PAT 38 KNEE (Knees) ×1 IMPLANT
ATTUNE PS FEM RT SZ 7 CEM KNEE (Femur) ×1 IMPLANT
ATTUNE PSRP INSR SZ7 8 KNEE (Insert) ×1 IMPLANT
BASE TIBIA ATTUNE KNEE SYS SZ6 (Knees) IMPLANT
BATTERY INSTRU NAVIGATION (MISCELLANEOUS) ×8 IMPLANT
BLADE SAW 70X12.5 (BLADE) ×2 IMPLANT
BLADE SAW 90X13X1.19 OSCILLAT (BLADE) ×2 IMPLANT
BLADE SAW 90X25X1.19 OSCILLAT (BLADE) ×2 IMPLANT
CEMENT HV SMART SET (Cement) ×2 IMPLANT
COOLER POLAR GLACIER W/PUMP (MISCELLANEOUS) ×2 IMPLANT
CUFF TOURN SGL QUICK 24 (TOURNIQUET CUFF)
CUFF TOURN SGL QUICK 34 (TOURNIQUET CUFF) ×2
CUFF TRNQT CYL 24X4X16.5-23 (TOURNIQUET CUFF) IMPLANT
CUFF TRNQT CYL 34X4.125X (TOURNIQUET CUFF) IMPLANT
DRAPE 3/4 80X56 (DRAPES) ×2 IMPLANT
DRSG DERMACEA 8X12 NADH (GAUZE/BANDAGES/DRESSINGS) ×2 IMPLANT
DRSG MEPILEX SACRM 8.7X9.8 (GAUZE/BANDAGES/DRESSINGS) ×2 IMPLANT
DRSG OPSITE POSTOP 4X14 (GAUZE/BANDAGES/DRESSINGS) ×2 IMPLANT
DRSG TEGADERM 4X4.75 (GAUZE/BANDAGES/DRESSINGS) ×2 IMPLANT
DURAPREP 26ML APPLICATOR (WOUND CARE) ×4 IMPLANT
ELECT CAUTERY BLADE 6.4 (BLADE) ×2 IMPLANT
ELECT REM PT RETURN 9FT ADLT (ELECTROSURGICAL) ×2
ELECTRODE REM PT RTRN 9FT ADLT (ELECTROSURGICAL) ×1 IMPLANT
EX-PIN ORTHOLOCK NAV 4X150 (PIN) ×4 IMPLANT
GAUZE 4X4 16PLY ~~LOC~~+RFID DBL (SPONGE) ×2 IMPLANT
GLOVE SURG ENC MOIS LTX SZ7.5 (GLOVE) ×4 IMPLANT
GLOVE SURG ENC TEXT LTX SZ7.5 (GLOVE) ×4 IMPLANT
GLOVE SURG UNDER LTX SZ8 (GLOVE) ×2 IMPLANT
GLOVE SURG UNDER POLY LF SZ7.5 (GLOVE) ×2 IMPLANT
GOWN STRL REUS W/ TWL LRG LVL3 (GOWN DISPOSABLE) ×2 IMPLANT
GOWN STRL REUS W/ TWL XL LVL3 (GOWN DISPOSABLE) ×1 IMPLANT
GOWN STRL REUS W/TWL LRG LVL3 (GOWN DISPOSABLE) ×4
GOWN STRL REUS W/TWL XL LVL3 (GOWN DISPOSABLE) ×2
HEMOVAC 400CC 10FR (MISCELLANEOUS) ×2 IMPLANT
HOLDER FOLEY CATH W/STRAP (MISCELLANEOUS) ×2 IMPLANT
HOOD PEEL AWAY FLYTE STAYCOOL (MISCELLANEOUS) ×4 IMPLANT
IRRIGATION SURGIPHOR STRL (IV SOLUTION) ×2 IMPLANT
IV NS IRRIG 3000ML ARTHROMATIC (IV SOLUTION) ×2 IMPLANT
KIT TURNOVER KIT A (KITS) ×2 IMPLANT
KNIFE SCULPS 14X20 (INSTRUMENTS) ×2 IMPLANT
LABEL OR SOLS (LABEL) ×2 IMPLANT
MANIFOLD NEPTUNE II (INSTRUMENTS) ×4 IMPLANT
NDL SAFETY ECLIPSE 18X1.5 (NEEDLE) ×1 IMPLANT
NDL SPNL 20GX3.5 QUINCKE YW (NEEDLE) ×2 IMPLANT
NEEDLE HYPO 18GX1.5 SHARP (NEEDLE) ×2
NEEDLE SPNL 20GX3.5 QUINCKE YW (NEEDLE) ×4 IMPLANT
PACK TOTAL KNEE (MISCELLANEOUS) ×2 IMPLANT
PAD WRAPON POLAR KNEE (MISCELLANEOUS) ×1 IMPLANT
PENCIL SMOKE EVACUATOR COATED (MISCELLANEOUS) ×2 IMPLANT
PIN DRILL FIX HALF THREAD (BIT) ×4 IMPLANT
PIN DRILL QUICK PACK ×3 IMPLANT
PIN FIXATION 1/8DIA X 3INL (PIN) ×2 IMPLANT
PULSAVAC PLUS IRRIG FAN TIP (DISPOSABLE) ×2
SOL PREP PVP 2OZ (MISCELLANEOUS) ×2
SOLUTION PREP PVP 2OZ (MISCELLANEOUS) ×1 IMPLANT
SPONGE DRAIN TRACH 4X4 STRL 2S (GAUZE/BANDAGES/DRESSINGS) ×2 IMPLANT
SPONGE T-LAP 18X18 ~~LOC~~+RFID (SPONGE) ×6 IMPLANT
STAPLER SKIN PROX 35W (STAPLE) ×2 IMPLANT
STOCKINETTE IMPERV 14X48 (MISCELLANEOUS) ×1 IMPLANT
STRAP TIBIA SHORT (MISCELLANEOUS) ×2 IMPLANT
SUCTION FRAZIER HANDLE 10FR (MISCELLANEOUS) ×2
SUCTION TUBE FRAZIER 10FR DISP (MISCELLANEOUS) ×1 IMPLANT
SUT VIC AB 0 CT1 36 (SUTURE) ×4 IMPLANT
SUT VIC AB 1 CT1 36 (SUTURE) ×4 IMPLANT
SUT VIC AB 2-0 CT2 27 (SUTURE) ×2 IMPLANT
SYR 20ML LL LF (SYRINGE) ×2 IMPLANT
SYR 30ML LL (SYRINGE) ×4 IMPLANT
TIBIA ATTUNE KNEE SYS BASE SZ6 (Knees) ×2 IMPLANT
TIP FAN IRRIG PULSAVAC PLUS (DISPOSABLE) ×1 IMPLANT
TOWEL OR 17X26 4PK STRL BLUE (TOWEL DISPOSABLE) ×2 IMPLANT
TOWER CARTRIDGE SMART MIX (DISPOSABLE) ×2 IMPLANT
TRAY FOLEY MTR SLVR 16FR STAT (SET/KITS/TRAYS/PACK) ×2 IMPLANT
WRAPON POLAR PAD KNEE (MISCELLANEOUS) ×2

## 2021-07-03 NOTE — Anesthesia Procedure Notes (Signed)
Spinal  Patient location during procedure: OR Start time: 11/07/2020 12:32 PM End time: 11/15/2020 12:32 PM Reason for block: surgical anesthesia Staffing Performed: resident/CRNA  Anesthesiologist: Johnson, Eric Mitchell, MD Resident/CRNA: Brown, Kristina C, CRNA Preanesthetic Checklist Completed: patient identified, IV checked, site marked, risks and benefits discussed, surgical consent, monitors and equipment checked, pre-op evaluation and timeout performed Spinal Block Patient position: sitting Prep: Betadine Patient monitoring: heart rate, continuous pulse ox, blood pressure and cardiac monitor Approach: midline Location: L4-5 Injection technique: single-shot Needle Needle type: Whitacre and Introducer  Needle gauge: 24 G Needle length: 9 cm Assessment Events: CSF return Additional Notes Negative paresthesia. Negative blood return. Positive free-flowing CSF. Expiration date of kit checked and confirmed. Patient tolerated procedure well, without complications.      

## 2021-07-03 NOTE — H&P (Signed)
The patient has been re-examined, and the chart reviewed, and there have been no interval changes to the documented history and physical.    The risks, benefits, and alternatives have been discussed at length. The patient expressed understanding of the risks benefits and agreed with plans for surgical intervention.  Kinze Labo P. Avabella Wailes, Jr. M.D.    

## 2021-07-03 NOTE — Transfer of Care (Signed)
Immediate Anesthesia Transfer of Care Note  Patient: [name]  Procedure(s) Performed: COMPUTER ASSISTED TOTAL KNEE ARTHROPLASTY - RNFA (Right: Knee)  Patient Location: PACU  Anesthesia Type:General  Level of Consciousness: drowsy  Airway & Oxygen Therapy: Patient Spontanous Breathing and Patient connected to face mask oxygen  Post-op Assessment: Report given to RN  Post vital signs: stable  Last Vitals:  Vitals Value Taken Time  BP 110/64 07/03/21 1553  Temp    Pulse 73 07/03/21 1555  Resp 22 07/03/21 1555  SpO2 98 % 07/03/21 1555  Vitals shown include unvalidated device data.  Last Pain:  Vitals:   07/03/21 1046  TempSrc: Temporal  PainSc: 0-No pain         Complications: No notable events documented.

## 2021-07-03 NOTE — Anesthesia Preprocedure Evaluation (Signed)
Anesthesia Evaluation  Patient identified by MRN, date of birth, ID band Patient awake    Reviewed: Allergy & Precautions, H&P , NPO status , Patient's Chart, lab work & pertinent test results  History of Anesthesia Complications (+) PONV and history of anesthetic complications (REMOTE. No PONV with last anesthetic )  Airway Mallampati: II  TM Distance: >3 FB Neck ROM: full    Dental  (+) Teeth Intact   Pulmonary neg pulmonary ROS,    Pulmonary exam normal        Cardiovascular Exercise Tolerance: Good METS: 7 - 9 Mets negative cardio ROS Normal cardiovascular exam     Neuro/Psych negative neurological ROS  negative psych ROS   GI/Hepatic Neg liver ROS, GERD  Controlled and Medicated,  Endo/Other  negative endocrine ROS  Renal/GU      Musculoskeletal  (+) Arthritis ,   Abdominal (+) + obese,   Peds  Hematology negative hematology ROS (+)   Anesthesia Other Findings Past Medical History: No date: Arthritis No date: GERD (gastroesophageal reflux disease) No date: History of kidney stones  Past Surgical History: 2007: ABDOMINAL HYSTERECTOMY     Comment:  Partial, has both ovaries; for uterine prolapse No date: APPENDECTOMY 1988: LAPAROSCOPY FOR ECTOPIC PREGNANCY  BMI    Body Mass Index: 35.91 kg/m      Reproductive/Obstetrics negative OB ROS                             Anesthesia Physical  Anesthesia Plan  ASA: II  Anesthesia Plan: Spinal   Post-op Pain Management:    Induction: Intravenous  PONV Risk Score and Plan: 4 or greater and Dexamethasone, Ondansetron, Midazolam, Treatment may vary due to age or medical condition and Scopolamine patch - Pre-op  Airway Management Planned: Natural Airway and Nasal Cannula  Additional Equipment:   Intra-op Plan:   Post-operative Plan:   Informed Consent: I have reviewed the patients History and Physical, chart, labs and  discussed the procedure including the risks, benefits and alternatives for the proposed anesthesia with the patient or authorized representative who has indicated his/her understanding and acceptance.     Dental Advisory Given  Plan Discussed with: Anesthesiologist, CRNA and Surgeon  Anesthesia Plan Comments:         Anesthesia Quick Evaluation

## 2021-07-03 NOTE — Op Note (Signed)
OPERATIVE NOTE  DATE OF SURGERY:  07/03/2021  PATIENT NAME:  [name]   DOB: 02-Jan-1956  MRN: [id number]  PRE-OPERATIVE DIAGNOSIS: Degenerative arthrosis of the right knee, primary  POST-OPERATIVE DIAGNOSIS:  Same  PROCEDURE:  Right total knee arthroplasty using computer-assisted navigation  SURGEON:  Marciano Sequin. M.D.  ANESTHESIA: spinal  ESTIMATED BLOOD LOSS: 50 mL  FLUIDS REPLACED: 1000 mL of crystalloid  TOURNIQUET TIME: 96 minutes  DRAINS: 2 medium Hemovac drains  SOFT TISSUE RELEASES: Anterior cruciate ligament, posterior cruciate ligament, deep medial collateral ligament, patellofemoral ligament  IMPLANTS UTILIZED: DePuy Attune size 7 posterior stabilized femoral component (cemented), size 6 rotating platform tibial component (cemented), 38 mm medialized dome patella (cemented), and a 8 mm stabilized rotating platform polyethylene insert.  INDICATIONS FOR SURGERY: [name] is a 65 y.o. year old female with a long history of progressive knee pain. 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 The patient had not seen any significant improvement despite conservative nonsurgical intervention. After discussion of the risks and benefits of surgical intervention, the patient expressed understanding of the risks benefits and agree with plans for total knee arthroplasty.   The risks, benefits, and alternatives were discussed at length including but not limited to the risks of infection, bleeding, nerve injury, stiffness, blood clots, the need for revision surgery, cardiopulmonary complications, among others, and they were willing to proceed.  PROCEDURE IN DETAIL: The patient was brought into the operating room and, after adequate spinal anesthesia was achieved, a tourniquet was placed on the patient's upper thigh. The patient's knee and leg were cleaned and prepped with alcohol and DuraPrep and draped in the usual sterile fashion.  A "timeout" was performed as per usual protocol. The lower extremity was exsanguinated using an Esmarch, and the tourniquet was inflated to 300 mmHg. An anterior longitudinal incision was made followed by a standard mid vastus approach. The deep fibers of the medial collateral ligament were elevated in a subperiosteal fashion off of the medial flare of the tibia so as to maintain a continuous soft tissue sleeve. The patella was subluxed laterally and the patellofemoral ligament was incised. Inspection of the knee demonstrated severe degenerative changes with full-thickness loss of articular cartilage. Osteophytes were debrided using a rongeur. Anterior and posterior cruciate ligaments were excised. Two 4.0 mm Schanz pins were inserted in the femur and into the tibia for attachment of the array of trackers used for computer-assisted navigation. Hip center was identified using a circumduction technique. Distal landmarks were mapped using the computer. The distal femur and proximal tibia were mapped using the computer. The distal femoral cutting guide was positioned using computer-assisted navigation so as to achieve a 5 distal valgus cut. The femur was sized and it was felt that a size 7 femoral component was appropriate. A size 7 femoral cutting guide was positioned and the anterior cut was performed and verified using the computer. This was followed by completion of the posterior and chamfer cuts. Femoral cutting guide for the central box was then positioned in the center box cut was performed.  Attention was then directed to the proximal tibia. Medial and lateral menisci were excised. The extramedullary tibial cutting guide was positioned using computer-assisted navigation so as to achieve a 0 varus-valgus alignment and 3 posterior slope. The cut was performed and verified using the computer. The proximal tibia was sized and it was felt that a size 6 tibial tray was appropriate. Tibial and femoral trials were  inserted followed  by insertion of a 8 mm polyethylene insert. This allowed for excellent mediolateral soft tissue balancing both in flexion and in full extension. Finally, the patella was cut and prepared so as to accommodate a 38 mm medialized dome patella. A patella trial was placed and the knee was placed through a range of motion with excellent patellar tracking appreciated. The femoral trial was removed after debridement of posterior osteophytes. The central post-hole for the tibial component was reamed followed by insertion of a keel punch. Tibial trials were then removed. Cut surfaces of bone were irrigated with copious amounts of normal saline using pulsatile lavage and then suctioned dry. Polymethylmethacrylate cement was prepared in the usual fashion using a vacuum mixer. Cement was applied to the cut surface of the proximal tibia as well as along the undersurface of a size 6 rotating platform tibial component. Tibial component was positioned and impacted into place. Excess cement was removed using [employer] fast streamer. Cement was then applied to the cut surfaces of the femur as well as along the posterior flanges of the size 7 femoral component. The femoral component was positioned and impacted into place. Excess cement was removed using [employer] fast streamer. An 8 mm polyethylene trial was inserted and the knee was brought into full extension with steady axial compression applied. Finally, cement was applied to the backside of a 38 mm medialized dome patella and the patellar component was positioned and patellar clamp applied. Excess cement was removed using [employer] fast streamer. After adequate curing of the cement, the tourniquet was deflated after a total tourniquet time of 96 minutes. Hemostasis was achieved using electrocautery. The knee was irrigated with copious amounts of normal saline using pulsatile lavage followed by 500 ml of Surgiphor and then suctioned dry. 20 mL of 1.3% Exparel and 60 mL of 0.25% Marcaine  in 40 mL of normal saline was injected along the posterior capsule, medial and lateral gutters, and along the arthrotomy site. An 8 mm stabilized rotating platform polyethylene insert was inserted and the knee was placed through a range of motion with excellent mediolateral soft tissue balancing appreciated and excellent patellar tracking noted. 2 medium drains were placed in the wound bed and brought out through separate stab incisions. The medial parapatellar portion of the incision was reapproximated using interrupted sutures of #1 Vicryl. Subcutaneous tissue was approximated in layers using first #0 Vicryl followed #2-0 Vicryl. The skin was approximated with skin staples. A sterile dressing was applied.  The patient tolerated the procedure well and was transported to the recovery room in stable condition.    Emiliana Blaize P. Holley Bouche., M.D.

## 2021-07-03 NOTE — Plan of Care (Signed)

## 2021-07-04 DIAGNOSIS — M1711 Unilateral primary osteoarthritis, right knee: Secondary | ICD-10-CM | POA: Diagnosis not present

## 2021-07-04 MED ORDER — ACETAMINOPHEN 325 MG PO TABS: 325.0000 mg | ORAL_TABLET | Freq: Four times a day (QID) | ORAL | Status: AC | PRN

## 2021-07-04 MED ORDER — CELECOXIB 200 MG PO CAPS: 200.0000 mg | ORAL_CAPSULE | Freq: Two times a day (BID) | ORAL | 0 refills | Status: AC

## 2021-07-04 MED ORDER — TRAMADOL HCL 50 MG PO TABS: 50.0000 mg | ORAL_TABLET | ORAL | 0 refills | Status: DC | PRN

## 2021-07-04 MED ORDER — RHOFADE 1 % EX CREA: 1.0000 "application " | TOPICAL_CREAM | Freq: Every day | CUTANEOUS | Status: DC | PRN

## 2021-07-04 MED ORDER — ENOXAPARIN SODIUM 40 MG/0.4ML IJ SOSY: 40.0000 mg | PREFILLED_SYRINGE | INTRAMUSCULAR | 0 refills | Status: DC

## 2021-07-04 MED ORDER — OXYCODONE HCL 5 MG PO TABS: 5.0000 mg | ORAL_TABLET | ORAL | 0 refills | Status: DC | PRN

## 2021-07-04 MED ORDER — SENNOSIDES-DOCUSATE SODIUM 8.6-50 MG PO TABS: 1.0000 | ORAL_TABLET | Freq: Two times a day (BID) | ORAL | 0 refills | Status: DC

## 2021-07-04 NOTE — Plan of Care (Signed)
  Problem: Education: Goal: Knowledge of General Education information will improve Description: Including pain rating scale, medication(s)/side effects and non-pharmacologic comfort measures 07/04/2021 1241 by Quintella Reichert, LPN Outcome: Adequate for Discharge 07/04/2021 1236 by Quintella Reichert, LPN Outcome: Adequate for Discharge   Problem: Health Behavior/Discharge Planning: Goal: Ability to manage health-related needs will improve 07/04/2021 1241 by Quintella Reichert, LPN Outcome: Adequate for Discharge 07/04/2021 1236 by Quintella Reichert, LPN Outcome: Adequate for Discharge   Problem: Clinical Measurements: Goal: Ability to maintain clinical measurements within normal limits will improve 07/04/2021 1241 by Quintella Reichert, LPN Outcome: Adequate for Discharge 07/04/2021 1236 by Quintella Reichert, LPN Outcome: Adequate for Discharge Goal: Will remain free from infection 07/04/2021 1241 by Quintella Reichert, LPN Outcome: Adequate for Discharge 07/04/2021 1236 by Quintella Reichert, LPN Outcome: Adequate for Discharge Goal: Diagnostic test results will improve 07/04/2021 1241 by Quintella Reichert, LPN Outcome: Adequate for Discharge 07/04/2021 1236 by Quintella Reichert, LPN Outcome: Adequate for Discharge Goal: Respiratory complications will improve 07/04/2021 1241 by Quintella Reichert, LPN Outcome: Adequate for Discharge 07/04/2021 1236 by Quintella Reichert, LPN Outcome: Adequate for Discharge Goal: Cardiovascular complication will be avoided 07/04/2021 1241 by Quintella Reichert, LPN Outcome: Adequate for Discharge 07/04/2021 1236 by Quintella Reichert, LPN Outcome: Adequate for Discharge   Problem: Activity: Goal: Risk for activity intolerance will decrease 07/04/2021 1241 by Quintella Reichert, LPN Outcome: Adequate for Discharge 07/04/2021 1236 by Quintella Reichert, LPN Outcome: Adequate for Discharge   Problem: Nutrition: Goal: Adequate nutrition will be maintained 07/04/2021 1241 by Quintella Reichert, LPN Outcome: Adequate for Discharge 07/04/2021 1236  by Quintella Reichert, LPN Outcome: Adequate for Discharge   Problem: Coping: Goal: Level of anxiety will decrease 07/04/2021 1241 by Quintella Reichert, LPN Outcome: Adequate for Discharge 07/04/2021 1236 by Quintella Reichert, LPN Outcome: Adequate for Discharge   Problem: Elimination: Goal: Will not experience complications related to bowel motility 07/04/2021 1241 by Quintella Reichert, LPN Outcome: Adequate for Discharge 07/04/2021 1236 by Quintella Reichert, LPN Outcome: Adequate for Discharge Goal: Will not experience complications related to urinary retention 07/04/2021 1241 by Quintella Reichert, LPN Outcome: Adequate for Discharge 07/04/2021 1236 by Quintella Reichert, LPN Outcome: Adequate for Discharge   Problem: Pain Managment: Goal: General experience of comfort will improve 07/04/2021 1241 by Quintella Reichert, LPN Outcome: Adequate for Discharge 07/04/2021 1236 by Quintella Reichert, LPN Outcome: Adequate for Discharge   Problem: Safety: Goal: Ability to remain free from injury will improve 07/04/2021 1241 by Quintella Reichert, LPN Outcome: Adequate for Discharge 07/04/2021 1236 by Quintella Reichert, LPN Outcome: Adequate for Discharge   Problem: Skin Integrity: Goal: Risk for impaired skin integrity will decrease 07/04/2021 1241 by Quintella Reichert, LPN Outcome: Adequate for Discharge 07/04/2021 1236 by Quintella Reichert, LPN Outcome: Adequate for Discharge

## 2021-07-04 NOTE — Progress Notes (Signed)
Physical Therapy Treatment Patient Details Name: [name] MRN: [id number] DOB: October 02, 1956 Today's Date: 07/04/2021    History of Present Illness Pt is a 65 y/o F admitted for scheduled R TKA by Dr. Marry Guan on 07/03/21. PMH: anxiety, COPD, depression, seizures    PT Comments    Pt seen for PT tx with pt completing sit>stand with mod I. Pt ambulated 1 lap around nurses station & negotiated 4 steps with B rails & supervision. Discussed home modifications (ex: removing throw rugs with pt reporting that's already been completed). Pt still eager to d/c home today.   Follow Up Recommendations  Home health PT;Supervision for mobility/OOB     Equipment Recommendations  Rolling walker with 5" wheels    Recommendations for Other Services       Precautions / Restrictions Precautions Precautions: Fall;Knee Restrictions Weight Bearing Restrictions: Yes RLE Weight Bearing: Weight bearing as tolerated    Mobility  Bed Mobility Overal bed mobility: Needs Assistance Bed Mobility: Supine to Sit     Supine to sit: Supervision;HOB elevated     General bed mobility comments: not observed, pt received & left sitting in recliner    Transfers Overall transfer level: Modified independent Equipment used: Rolling walker (2 wheeled) Transfers: Sit to/from Stand Sit to Stand: Modified independent (Device/Increase time)         General transfer comment: cuing for hand placement  Ambulation/Gait Ambulation/Gait assistance: Supervision Gait Distance (Feet): 175 Feet Assistive device: Rolling walker (2 wheeled) Gait Pattern/deviations: Decreased step length - right;Decreased stride length;Step-through pattern;Decreased weight shift to right Gait velocity: slightly decreased       Stairs Stairs: Yes Stairs assistance: Supervision Stair Management: Two rails;Step to pattern Number of Stairs: 4 (6") General stair comments: compensatory pattern   Wheelchair Mobility     Modified Rankin (Stroke Patients Only)       Balance Overall balance assessment: Needs assistance Sitting-balance support: Feet supported;No upper extremity supported Sitting balance-Leahy Scale: Normal       Standing balance-Leahy Scale: Fair                              Cognition Arousal/Alertness: Awake/alert Behavior During Therapy: WFL for tasks assessed/performed Overall Cognitive Status: Within Functional Limits for tasks assessed                                 General Comments: Very pleasant & motivated to participate.      Exercises     General Comments General comments (skin integrity, edema, etc.): Pt reports she got up by herself & ambulated in room without assistance. Educated pt on need to call for assistance 2/2 being hooked up to SCDs & polar care. Nurse notified.      Pertinent Vitals/Pain Pain Assessment: Faces Pain Score: 4  Pain Location: right knee Pain Descriptors / Indicators: Aching Pain Intervention(s): Monitored during session;Repositioned (polar care used at beginning & end of session)    Home Living Family/patient expects to be discharged to:: Private residence Living Arrangements: Alone;Other (Comment) (sisters plan to come stay with her & provide 24 hr assist at d/c) Available Help at Discharge: Family Type of Home: House Home Access: Stairs to enter Entrance Stairs-Rails: Right;Left Home Layout: One level Home Equipment: None Additional Comments: Pt able to perform toilet transfer without the use of grab bar or elevated seat    Prior Function Level  of Independence: Independent      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   PT Goals (current goals can now be found in the care plan section) Acute Rehab PT Goals Patient Stated Goal: go home PT Goal Formulation: With patient Time For Goal Achievement: 07/18/21 Potential to Achieve Goals: Good Progress towards PT  goals: Progressing toward goals    Frequency    Min 2X/week      PT Plan Current plan remains appropriate    Co-evaluation              AM-PAC PT "6 Clicks" Mobility   Outcome Measure  Help needed turning from your back to your side while in a flat bed without using bedrails?: None Help needed moving from lying on your back to sitting on the side of a flat bed without using bedrails?: None Help needed moving to and from a bed to a chair (including a wheelchair)?: A Little Help needed standing up from a chair using your arms (e.g., wheelchair or bedside chair)?: None Help needed to walk in hospital room?: A Little Help needed climbing 3-5 steps with a railing? : A Little 6 Click Score: 21    End of Session Equipment Utilized During Treatment: Gait belt Activity Tolerance: Patient tolerated treatment well Patient left: in chair;with call bell/phone within reach (BLE SCDs & polar care donned) Nurse Communication: Mobility status PT Visit Diagnosis: Unsteadiness on feet (R26.81);Muscle weakness (generalized) (M62.81);Difficulty in walking, not elsewhere classified (R26.2);Pain Pain - Right/Left: Right Pain - part of body: Knee     Time: 1122-1132 PT Time Calculation (min) (ACUTE ONLY): 10 min  Charges:  $Therapeutic Exercise: 8-22 mins $Therapeutic Activity: 8-22 mins                     [name], PT, DPT 07/04/21, 12:49 PM    Waunita Schooner 07/04/2021, 12:46 PM

## 2021-07-04 NOTE — Care Management Obs Status (Signed)
[place] NOTIFICATION   Patient Details  Name: [name] MRN: [id number] Date of Birth: [birth year]   Medicare Observation Status Notification Given:  Yes    Alberteen Sam, LCSW 07/04/2021, 12:54 PM

## 2021-07-04 NOTE — Progress Notes (Signed)
   Subjective: 1 Day Post-Op Procedure(s) (LRB): COMPUTER ASSISTED TOTAL KNEE ARTHROPLASTY - RNFA (Right) Patient reports pain as mild.   Patient is well, and has had no acute complaints or problems Denies any CP, SOB, ABD pain. We will continue therapy today.  Plan is to go Home after hospital stay.  Objective: Vital signs in last 24 hours: Temp:  [97.5 F (36.4 C)-98.1 F (36.7 C)] 97.5 F (36.4 C) (08/06 0754) Pulse Rate:  [53-79] 53 (08/06 0754) Resp:  [15-22] 15 (08/06 0754) BP: (104-149)/(55-80) 111/55 (08/06 0754) SpO2:  [93 %-100 %] 97 % (08/06 0754) Weight:  [104.1 kg] 104.1 kg (08/05 1046)  Intake/Output from previous day: 08/05 0701 - 08/06 0700 In: 2692.5 [P.O.:120; I.V.:1972.5; IV Piggyback:600] Out: 1230 [Urine:1000; Drains:180; Blood:50] Intake/Output this shift: No intake/output data recorded.  No results for input(s): HGB in the last 72 hours. No results for input(s): WBC, RBC, HCT, PLT in the last 72 hours. No results for input(s): NA, K, CL, CO2, BUN, CREATININE, GLUCOSE, CALCIUM in the last 72 hours. Recent Labs    07/03/21 1037  INR 1.1    EXAM General - Patient is Alert, Appropriate, and Oriented Extremity - Neurovascular intact Sensation intact distally Intact pulses distally Dorsiflexion/Plantar flexion intact No cellulitis present Compartment soft Dressing - dressing C/D/I and no drainage, Hemovac intact Motor Function - intact, moving foot and toes well on exam.   Past Medical History:  Diagnosis Date   Actinic keratoses 12/07/2018   Right upper arm. Right medial calf.   Arthritis    GERD (gastroesophageal reflux disease)    Headache    History of kidney stones     Assessment/Plan:   1 Day Post-Op Procedure(s) (LRB): COMPUTER ASSISTED TOTAL KNEE ARTHROPLASTY - RNFA (Right) Active Problems:   Total knee replacement status  Estimated body mass index is 37.04 kg/m as calculated from the following:   Height as of this encounter:  '5\' 6"'$  (1.676 m).   Weight as of this encounter: 104.1 kg. Advance diet Up with therapy Work on bowel movement Vital signs are stable Pain controlled Care management to assist with discharge to home with home health PT pending completion of PT goals.  DVT Prophylaxis - Lovenox, Foot Pumps, and TED hose Weight-Bearing as tolerated to right leg   T. Rachelle Hora, PA-C [place] 07/04/2021, 8:24 AM

## 2021-07-04 NOTE — Progress Notes (Signed)
Patient is A & O and able to make needs known. AVS reviewed with patient who verbalized understanding via teach back re medications, follow up appointments, signs and symptoms to notify MD as well as limitations and restrictions. Celecoxib, enoxaparin, oxycodone, senna and tramadol have been called into [id number] and patient aware to pick up. Patient will transport home via private vehicle.

## 2021-07-04 NOTE — TOC Transition Note (Signed)
Transition of Care [facility]) - CM/SW Discharge Note   Patient Details  Name: ROCKLYNN WANAMAKER MRN: [id number] Date of Birth: 04/11/1956  [facility]) CM/SW Contact:  Alberteen Sam, LCSW Phone Number: 07/04/2021, 1:00 PM   Clinical Narrative:     Patient to discharge home with home health, is set up with [place] for PT. [place] with [place] informed of dc today.   Patient agreeable to rolling walker, patient reports she would like to discharge home and have walker delivered to home vs waiting on it. CSW has informed [place] with [employer] who will get walker shipped to home.   No other needs identified at this time.    Final next level of care: [place]     Patient Goals and CMS Choice Patient states their goals for this hospitalization and ongoing recovery are:: to go home CMS Medicare.gov Compare Post Acute Care list provided to:: Patient Choice offered to / list presented to : Patient  Discharge Placement                    Patient and family notified of of transfer: 07/04/21  Discharge Plan and Services                DME Arranged: Gilford Rile rolling DME Agency: [employer] Date DME Agency Contacted: 07/04/21 Time DME Agency Contacted: [id number] Representative spoke with at DME Agency: [place]: PT [place]: [place] Date [place]: 07/04/21 Time [place]: 1300 Representative spoke with at [place]: [place] ([place]) Interventions     Readmission Risk Interventions No flowsheet data found.

## 2021-07-04 NOTE — Plan of Care (Signed)

## 2021-07-04 NOTE — Discharge Summary (Signed)
Physician Discharge Summary  Patient ID: ILYN Craig MRN: [id number] DOB/AGE: Jul 26, 1956 65 y.o.  Admit date: 07/03/2021 Discharge date: 07/04/2021  Admission Diagnoses:  Total knee replacement status [Z96.659]   Discharge Diagnoses: Patient Active Problem List   Diagnosis Date Noted   Total knee replacement status 07/03/2021   Primary osteoarthritis of right knee 03/22/2021   S/P arthroscopy of right knee 11/24/2019   Chronic lower back pain 09/16/2019   Family history of breast cancer in sister 06/29/2017   Rotator cuff syndrome of both shoulders 06/29/2017   Urticaria due to cold 06/29/2017   Chronic cough 10/02/2013   GERD (gastroesophageal reflux disease) 10/02/2013   Other screening mammogram 07/07/2011   COLONIC POLYPS, ADENOMATOUS, HX OF 01/23/2010   MIGRAINE, COMMON 12/09/2008   NEPHROLITHIASIS, HX OF 12/09/2008    Past Medical History:  Diagnosis Date   Actinic keratoses 12/07/2018   Right upper arm. Right medial calf.   Arthritis    GERD (gastroesophageal reflux disease)    Headache    History of kidney stones      Transfusion: none   Consultants (if any):   Discharged Condition: Improved  Hospital Course: [name] is an 65 y.o. female who was admitted 07/03/2021 with a diagnosis of right knee osteoarthritis and went to the operating room on 07/03/2021 and underwent the above named procedures.    Surgeries: Procedure(s): COMPUTER ASSISTED TOTAL KNEE ARTHROPLASTY - RNFA on 07/03/2021 Patient tolerated the surgery well. Taken to PACU where she was stabilized and then transferred to the orthopedic floor.  Started on Lovenox 30 mg q 12 hrs. Foot pumps applied bilaterally at 80 mm. Heels elevated on bed with rolled towels. No evidence of DVT. Negative [place]. Physical therapy started on day #1 for gait training and transfer. OT started day #1 for ADL and assisted devices.  Patient's foley was d/c on day #1. Patient's IV and Hemovac were d/c on day  #1.  On post op day #1 patient made excellent progress of physical therapy completed all PT goals, patient was stable and ready for discharge to home with HHPT.  Implants: DePuy Attune size 7 posterior stabilized femoral component (cemented), size 6 rotating platform tibial component (cemented), 38 mm medialized dome patella (cemented), and a 8 mm stabilized rotating platform polyethylene insert.  She was given perioperative antibiotics:  Anti-infectives (From admission, onward)    Start     Dose/Rate Route Frequency Ordered Stop   07/03/21 1830  ceFAZolin (ANCEF) IVPB 2g/100 mL premix        2 g 200 mL/hr over 30 Minutes Intravenous Every 6 hours 07/03/21 1644 07/04/21 0130   07/03/21 1032  ceFAZolin (ANCEF) 2-4 GM/100ML-% IVPB       Note to Pharmacy: [place]   : cabinet override      07/03/21 1032 07/04/21 0824   07/03/21 0600  ceFAZolin (ANCEF) IVPB 2g/100 mL premix        2 g 200 mL/hr over 30 Minutes Intravenous On call to O.R. 07/02/21 2224 07/03/21 1253     .  She was given sequential compression devices, early ambulation, and Lovenox, teds for DVT prophylaxis.  She benefited maximally from the hospital stay and there were no complications.    Recent vital signs:  Vitals:   07/04/21 0619 07/04/21 0754  BP: 113/69 (!) 111/55  Pulse: (!) 56 (!) 53  Resp: 17 15  Temp: 97.8 F (36.6 C) (!) 97.5 F (36.4 C)  SpO2: 97% 97%  Recent laboratory studies:  Lab Results  Component Value Date   HGB 12.8 05/13/2021   Lab Results  Component Value Date   WBC 3.0 (L) 05/13/2021   PLT 212 05/13/2021   Lab Results  Component Value Date   INR 1.1 07/03/2021   Lab Results  Component Value Date   NA 139 05/13/2021   K 4.1 05/13/2021   CL 107 05/13/2021   CO2 27 05/13/2021   BUN 20 05/13/2021   CREATININE 0.66 05/13/2021   GLUCOSE 100 (H) 05/13/2021    Discharge Medications:   Allergies as of 07/04/2021       Reactions   Meloxicam Other (See Comments)    Blood in urine        Medication List     STOP taking these medications    naproxen sodium 220 MG tablet Commonly known as: ALEVE       TAKE these medications    acetaminophen 325 MG tablet Commonly known as: TYLENOL Take 1-2 tablets (325-650 mg total) by mouth every 6 (six) hours as needed for mild pain (pain score 1-3 or temp > 100.5).   celecoxib 200 MG capsule Commonly known as: CELEBREX Take 1 capsule (200 mg total) by mouth 2 (two) times daily.   frovatriptan 2.5 MG tablet Commonly known as: FROVA Take 2.5 mg by mouth every 2 (two) hours as needed for migraine.   loratadine 10 MG tablet Commonly known as: CLARITIN Take 10 mg by mouth daily as needed for allergies.   nitrofurantoin 100 MG capsule Commonly known as: MACRODANTIN Take 100 mg by mouth as needed (within 2 hours of intercourse).   NON FORMULARY Take 1 Scoop by mouth daily. Add to 10 oz water Ardeth Perfect MD [facility])   NON FORMULARY Take 15 mLs by mouth daily in the afternoon. Adds to coffee (EVERBELLA Micelle Liposomal Complete Collagen)   OMEGA 3 PO Take 2 capsules by mouth 2 (two) times daily.   omeprazole 20 MG tablet Commonly known as: PRILOSEC OTC Take 20 mg by mouth every evening.   oxyCODONE 5 MG immediate release tablet Commonly known as: Oxy IR/ROXICODONE Take 1 tablet (5 mg total) by mouth every 4 (four) hours as needed for moderate pain (pain score 4-6).   PROBIOTIC DAILY PO Take 1 capsule by mouth 3 (three) times daily. Nucific Bio - X4 weight management probiotic   QUERCETIN PO Take 2 capsules by mouth 2 (two) times daily.   Rhofade 1 % Crea Generic drug: Oxymetazoline HCl Apply 1 application topically daily as needed (rosacea).   senna-docusate 8.6-50 MG tablet Commonly known as: Senokot-S Take 1 tablet by mouth 2 (two) times daily.   traMADol 50 MG tablet Commonly known as: ULTRAM Take 1-2 tablets (50-100 mg total) by mouth every 4 (four) hours as needed for  moderate pain.   VITAMIN D3 PO Take 1 capsule by mouth 2 (two) times daily.               Durable Medical Equipment  (From admission, onward)           Start     Ordered   07/03/21 1645  DME Walker rolling  Once       Question:  Patient needs a walker to treat with the following condition  Answer:  Total knee replacement status   07/03/21 1644   07/03/21 1645  DME Bedside commode  Once       Question:  Patient needs a bedside commode to treat  with the following condition  Answer:  Total knee replacement status   07/03/21 1644            Diagnostic Studies: DG Knee Right Port  Result Date: 07/03/2021 CLINICAL DATA:  Postop EXAM: PORTABLE RIGHT KNEE - 1-2 VIEW COMPARISON:  None. FINDINGS: Status post right knee replacement with intact hardware and normal alignment. No fracture seen. Probable ghost tracks in the proximal tibia. Gas in the soft tissues consistent with recent surgery. Suprapatellar drainage catheter IMPRESSION: Status post right knee replacement with expected surgical changes Electronically Signed   By: Donavan Foil M.D.   On: 07/03/2021 16:26    Disposition: Discharge disposition: 06-Home-Health Care Svc          Follow-up Information     Fausto Skillern, PA-C Follow up on 07/17/2021.   Specialty: Orthopedic Surgery Why: at 9:45am Contact information: [place] [address] [phone]         Dereck Leep, MD Follow up on 08/18/2021.   Specialty: Orthopedic Surgery Why: at 10:45am Contact information: [place] [address] [phone]                  Signed: Feliberto Gottron 07/04/2021, 12:12 PM

## 2021-07-04 NOTE — Care Management CC44 (Signed)
Condition Code 44 Documentation Completed  Patient Details  Name: [name] MRN: [id number] Date of Birth: 07/15/1956   Condition Code 44 given:  Yes Patient signature on Condition Code 44 notice:  Yes Documentation of 2 MD's agreement:  Yes Code 44 added to claim:  Yes    Alberteen Sam, LCSW 07/04/2021, 12:54 PM

## 2021-07-04 NOTE — Evaluation (Signed)
Physical Therapy Evaluation Patient Details Name: [name] MRN: [id number] DOB: [birth year] Today's Date: 07/04/2021   History of Present Illness  Pt is a 65 y/o F admitted for scheduled R TKA by Dr. Marry Guan on 07/03/21. PMH: anxiety, COPD, depression, seizures  Clinical Impression  Pt seen for PT evaluation with PT educating her on polar care usage and need to promote RLE knee extension. Provided pt with HEP & pt performs RLE strengthening exercises with instructional cuing for technique. Pt is able to complete bed mobility & transfers with supervision with education re: hand placement. Pt is able to ambulate 1 lap around nurses station with RW & close supervision & negotiates 4 steps with B rails & CGA with PT educating her on caregiver positioning, RW management, and compensatory pattern. Pt denies any concerns re: d/c & is hopeful to d/c home today. Will continue to follow pt acutely to progress mobility, strengthening & ROM as able.     Follow Up Recommendations Home health PT;Supervision for mobility/OOB    Equipment Recommendations  Rolling walker with 5" wheels    Recommendations for Other Services       Precautions / Restrictions Precautions Precautions: Fall;Knee Restrictions Weight Bearing Restrictions: Yes RLE Weight Bearing: Weight bearing as tolerated      Mobility  Bed Mobility Overal bed mobility: Needs Assistance Bed Mobility: Supine to Sit     Supine to sit: Supervision;HOB elevated     General bed mobility comments: supine>sit with bed rails    Transfers Overall transfer level: Needs assistance Equipment used: Rolling walker (2 wheeled) Transfers: Sit to/from Stand Sit to Stand: Supervision         General transfer comment: cuing for hand placement  Ambulation/Gait Ambulation/Gait assistance: Supervision Gait Distance (Feet): 175 Feet Assistive device: Rolling walker (2 wheeled) Gait Pattern/deviations: Decreased step length -  right;Decreased stride length;Step-through pattern;Decreased weight shift to right Gait velocity: slightly decreased      Stairs Stairs: Yes Stairs assistance: Min guard Stair Management: Two rails Number of Stairs: 4 (6") General stair comments: Pt provides education & demo re: compensatory pattern & RW management. Pt negotiated 4 steps with B rails & CGA.  Wheelchair Mobility    Modified Rankin (Stroke Patients Only)       Balance Overall balance assessment: Needs assistance Sitting-balance support: Feet supported;No upper extremity supported Sitting balance-Leahy Scale: Normal       Standing balance-Leahy Scale: Fair                               Pertinent Vitals/Pain Pain Assessment: Faces Pain Score: 4  Pain Location: right knee Pain Descriptors / Indicators: Aching Pain Intervention(s): Monitored during session;Limited activity within patient's tolerance;Repositioned (polar care donned & doffed at end of session)    Home Living Family/patient expects to be discharged to:: Private residence Living Arrangements: Alone;Other (Comment) (sisters plan to come stay with her & provide 24 hr assist at d/c) Available Help at Discharge: Family Type of Home: House Home Access: Stairs to enter Entrance Stairs-Rails: Psychiatric nurse of Steps: 6 Home Layout: One level Home Equipment: None Additional Comments: Pt able to perform toilet transfer without the use of grab bar or elevated seat    Prior Function Level of Independence: Independent         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      General Comments      Exercises Total Joint Exercises Ankle Circles/Pumps: AROM;Right;10 reps;Supine Quad Sets: AROM;Supine;Strengthening;Right;10 reps Short Arc Quad: AROM;Strengthening;Right;10 reps;Supine Heel Slides: AROM;Right;10 reps;Supine Hip ABduction/ADduction: AROM;Strengthening;Right;10 reps;Supine (hip abduction slides) Straight Leg Raises: AROM;Strengthening;Right;10 reps;Supine Long Arc Quad: AROM;Strengthening;Right;10 reps;Seated Knee Flexion: AROM;PROM;AAROM;Right;10 reps;Seated Goniometric ROM: 1 degree from full extension, 94 degrees flexion   Assessment/Plan    PT Assessment Patient needs continued PT services  PT Problem List Decreased strength;Decreased mobility;Decreased balance;Pain;Decreased activity tolerance;Decreased range of motion       PT Treatment Interventions DME instruction;Therapeutic activities;Modalities;Gait training;Therapeutic exercise;Patient/family education;Stair training;Balance training;Functional mobility training;Neuromuscular re-education;Manual techniques    PT Goals (Current goals can be found in the Care Plan section)  Acute Rehab PT Goals Patient Stated Goal: go home PT Goal Formulation: With patient Time For Goal Achievement: 07/18/21 Potential to Achieve Goals: Good    Frequency Min 2X/week   Barriers to discharge        Co-evaluation               AM-PAC PT "6 Clicks" Mobility  Outcome Measure Help needed turning from your back to your side while in a flat bed without using bedrails?: None Help needed moving from lying on your back to sitting on the side of a flat bed without using bedrails?: None Help needed  moving to and from a bed to a chair (including a wheelchair)?: A Little Help needed standing up from a chair using your arms (e.g., wheelchair or bedside chair)?: A Little Help needed to walk in hospital room?: A Little Help needed climbing 3-5 steps with a railing? : A Little 6 Click Score: 20    End of Session Equipment Utilized During Treatment: Gait belt Activity Tolerance: Patient tolerated treatment well Patient left: in chair;with call bell/phone within reach (BLE foot pumps donned, polar care on RLE) Nurse Communication: Mobility status PT Visit Diagnosis: Unsteadiness on feet (R26.81);Muscle weakness (generalized) (M62.81);Difficulty in walking, not elsewhere classified (R26.2)    Time: [id number] PT Time Calculation (min) (ACUTE ONLY): 40 min   Charges:   PT Evaluation $PT Eval Low Complexity: 1 Low PT Treatments $Therapeutic Exercise: 8-22 mins $Therapeutic Activity: 8-22 mins        Lavone Nian, PT, DPT 07/04/21, 12:43 PM   Waunita Schooner 07/04/2021, 12:40 PM

## 2021-07-04 NOTE — Evaluation (Signed)
Occupational Therapy Evaluation Patient Details Name: [name] MRN: [id number] DOB: 1956/02/26 Today's Date: 07/04/2021    History of Present Illness [name] is a 65 y.o. year old female with a long history of progressive knee pain. 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 The patient had not seen any significant improvement despite conservative nonsurgical intervention. Pt underwent R TKR on 07/03/2021.   Clinical Impression   Pt seen for OT evaluation this date.  Pt lives alone and has a daughter nearby and also has a sister who will be staying with her for as long as she needs.  Pt was up and dressed this am and reports she would like to go home today.  She was seen for lower body dressing techniques this date and able to complete with modified independence without any use of adaptive equipment.  Pt able to complete toileting transfer with modified independence and does not require the use of grab bars or elevated seat to complete.  She will not require any additional OT services at discharge and will return home with supportive family to assist with higher level IADL tasks.  Pt was educated on polar care use and how to fill/operate for cold therapy at home.  Goals met, will discharge from OT at this time.     Follow Up Recommendations  No OT follow up    Equipment Recommendations  None recommended by OT    Recommendations for Other Services       Precautions / Restrictions Precautions Precautions: Knee Restrictions Weight Bearing Restrictions: Yes RLE Weight Bearing: Weight bearing as tolerated      Mobility Bed Mobility Overal bed mobility: Modified Independent                  Transfers Overall transfer level: Modified independent Equipment used: Rolling walker (2 wheeled)                  Balance Overall balance assessment: Mild deficits observed, not formally tested                                          ADL either performed or assessed with clinical judgement   ADL Overall ADL's : Needs assistance/impaired Eating/Feeding: Modified independent   Grooming: Modified independent Grooming Details (indicate cue type and reason): standing at sink Upper Body Bathing: Modified independent   Lower Body Bathing: Modified independent Lower Body Bathing Details (indicate cue type and reason): from seated position Upper Body Dressing : Modified independent   Lower Body Dressing: Modified independent Lower Body Dressing Details (indicate cue type and reason): Pt educated on lower body dressing techniques and able to demonstrate Toilet Transfer: Modified Programmer, applications Details (indicate cue type and reason): no use of grab bars or elevated toilet seat Toileting- Clothing Manipulation and Hygiene: Modified independent   Tub/ Shower Transfer: English as a second language teacher Details (indicate cue type and reason): Recommend she have someone with her for tub/shower transfers at home for safety Functional mobility during ADLs: Modified independent General ADL Comments: use of rolling walker to and from the bathroom     Vision Baseline Vision/History: No visual deficits       Perception     Praxis      Pertinent Vitals/Pain Pain Assessment: 0-10 Pain Score: 4  Pain Location: right knee Pain Descriptors / Indicators: Aching Pain Intervention(s): Limited activity within  patient's tolerance;Monitored during session;Repositioned     Hand Dominance Right   Extremity/Trunk Assessment Upper Extremity Assessment Upper Extremity Assessment: Generalized weakness   Lower Extremity Assessment Lower Extremity Assessment: Defer to PT evaluation       Communication Communication Communication: No difficulties   Cognition Arousal/Alertness: Awake/alert Behavior During Therapy: WFL for tasks assessed/performed Overall Cognitive Status: Within Functional  Limits for tasks assessed                                     General Comments       Exercises     Shoulder Instructions      Home Living Family/patient expects to be discharged to:: Private residence Living Arrangements: Alone;Other (Comment) (sister coming to stay with her, has a daughter 10 mins away) Available Help at Discharge: Family Type of Home: House Home Access: Stairs to enter Technical brewer of Steps: 6   Home Layout: One level     Bathroom Shower/Tub: Corporate investment banker: Standard Bathroom Accessibility: Yes How Accessible: Accessible via walker Home Equipment: None   Additional Comments: Pt able to perform toilet transfer without the use of grab bar or elevated seat      Prior Functioning/Environment Level of Independence: Independent                 OT Problem List: Decreased strength;Impaired balance (sitting and/or standing);Decreased knowledge of use of DME or AE      OT Treatment/Interventions:      OT Goals(Current goals can be found in the care plan section) Acute Rehab OT Goals Patient Stated Goal: To go back home and be as independent as possible. OT Goal Formulation: With patient Time For Goal Achievement: 07/11/21 Potential to Achieve Goals: Good ADL Goals Pt Will Perform Lower Body Dressing: with modified independence Pt Will Transfer to Toilet: with modified independence  OT Frequency:     Barriers to D/C:            Co-evaluation              AM-PAC OT "6 Clicks" Daily Activity     Outcome Measure Help from another person eating meals?: None Help from another person taking care of personal grooming?: None Help from another person toileting, which includes using toliet, bedpan, or urinal?: None Help from another person bathing (including washing, rinsing, drying)?: None Help from another person to put on and taking off regular upper body clothing?: None Help from another  person to put on and taking off regular lower body clothing?: None 6 Click Score: 24   End of Session Equipment Utilized During Treatment: Gait belt;Rolling walker  Activity Tolerance: Patient tolerated treatment well Patient left: in chair;with call bell/phone within reach  OT Visit Diagnosis: Muscle weakness (generalized) (M62.81);Pain Pain - Right/Left: Right Pain - part of body: Knee                Time: 4327-6147 OT Time Calculation (min): 25 min Charges:  OT General Charges $OT Visit: 1 Visit OT Evaluation $OT Eval Low Complexity: 1 Low OT Treatments $Self Care/Home Management : 8-22 mins  Lionardo Haze T Rodrigus Kilker, OTR/L, CLT   Levent Kornegay 07/04/2021, 10:51 AM

## 2021-07-06 ENCOUNTER — Encounter: Payer: Self-pay | Admitting: Orthopedic Surgery

## 2021-07-06 NOTE — Anesthesia Postprocedure Evaluation (Signed)
Anesthesia Post Note  Patient: [name]  Procedure(s) Performed: COMPUTER ASSISTED TOTAL KNEE ARTHROPLASTY - RNFA (Right: Knee)  Patient location during evaluation: PACU Anesthesia Type: Spinal Level of consciousness: oriented and awake and alert Pain management: pain level controlled Vital Signs Assessment: post-procedure vital signs reviewed and stable Respiratory status: spontaneous breathing, respiratory function stable and patient connected to nasal cannula oxygen Cardiovascular status: blood pressure returned to baseline and stable Postop Assessment: no headache, no backache and no apparent nausea or vomiting Anesthetic complications: no   No notable events documented.   Last Vitals:  Vitals:   07/04/21 0754 07/04/21 1220  BP: (!) 111/55 121/60  Pulse: (!) 53 61  Resp: 15 14  Temp: (!) 36.4 C 36.6 C  SpO2: 97% 99%    Last Pain:  Vitals:   07/04/21 0923  TempSrc:   PainSc: [place]

## 2021-09-14 ENCOUNTER — Encounter: Payer: Self-pay | Admitting: Dermatology

## 2021-09-18 ENCOUNTER — Other Ambulatory Visit: Payer: Self-pay | Admitting: Internal Medicine

## 2021-09-18 DIAGNOSIS — Z1231 Encounter for screening mammogram for malignant neoplasm of breast: Secondary | ICD-10-CM

## 2021-10-08 ENCOUNTER — Ambulatory Visit
Admission: RE | Admit: 2021-10-08 | Discharge: 2021-10-08 | Disposition: A | Discharge status: Home / Self Care | Payer: Medicare Other | Source: Ambulatory Visit | Attending: Internal Medicine | Admitting: Internal Medicine

## 2021-10-08 ENCOUNTER — Other Ambulatory Visit: Payer: Self-pay

## 2021-10-08 DIAGNOSIS — Z1231 Encounter for screening mammogram for malignant neoplasm of breast: Secondary | ICD-10-CM | POA: Diagnosis present

## 2022-01-07 DIAGNOSIS — M19011 Primary osteoarthritis, right shoulder: Secondary | ICD-10-CM | POA: Insufficient documentation

## 2022-01-07 DIAGNOSIS — M19012 Primary osteoarthritis, left shoulder: Secondary | ICD-10-CM | POA: Insufficient documentation

## 2022-04-07 ENCOUNTER — Ambulatory Visit (INDEPENDENT_AMBULATORY_CARE_PROVIDER_SITE_OTHER): Payer: Medicare Other | Admitting: Dermatology

## 2022-04-07 DIAGNOSIS — L820 Inflamed seborrheic keratosis: Secondary | ICD-10-CM | POA: Diagnosis not present

## 2022-04-07 DIAGNOSIS — L814 Other melanin hyperpigmentation: Secondary | ICD-10-CM | POA: Diagnosis not present

## 2022-04-07 DIAGNOSIS — L578 Other skin changes due to chronic exposure to nonionizing radiation: Secondary | ICD-10-CM

## 2022-04-07 NOTE — Progress Notes (Signed)
? ?  Follow-Up Visit ?  ?Subjective  ?[name] is a 66 y.o. female who presents for the following: Skin Problem (Patient here today for a spot at left cheek that gets red, scales and peels. The area will heal but repeats the cycle weeks later. The area is just starting to clear up. ). ? ?Area at left cheek treated with LN2 a few years ago.  ? ?The following portions of the chart were reviewed this encounter and updated as appropriate:  ?  ?  ? ?Review of Systems:  No other skin or systemic complaints except as noted in HPI or Assessment and Plan. ? ?Objective  ?Well appearing patient in no apparent distress; mood and affect are within normal limits. ? ?A focused examination was performed including face. Relevant physical exam findings are noted in the Assessment and Plan. ? ?left medial zygoma ?Pink brown waxy macule 1.0 cm ? ? ? ?Assessment & Plan  ?Inflamed seborrheic keratosis ?left medial zygoma ? ?RTC if not resolved within 6-8 weeks ? ?Destruction of lesion - left medial zygoma ? ?Destruction method: cryotherapy   ?Informed consent: discussed and consent obtained   ?Lesion destroyed using liquid nitrogen: Yes   ?Region frozen until ice ball extended beyond lesion: Yes   ?Outcome: patient tolerated procedure well with no complications   ?Post-procedure details: wound care instructions given   ?Additional details:  Prior to procedure, discussed risks of blister formation, small wound, skin dyspigmentation, or rare scar following cryotherapy. Recommend Vaseline ointment to treated areas while healing.  ? ? ?Actinic Damage ?- chronic, secondary to cumulative UV radiation exposure/sun exposure over time ?- diffuse scaly erythematous macules with underlying dyspigmentation ?- Recommend daily broad spectrum sunscreen SPF 30+ to sun-exposed areas, reapply every 2 hours as needed.  ?- Recommend staying in the shade or wearing long sleeves, sun glasses (UVA+UVB protection) and wide brim hats (4-inch brim around  the entire circumference of the hat). ?- Call for new or changing lesions. ? ?Lentigines ?- Scattered tan macules ?- Due to sun exposure ?- Benign-appering, observe ?- Recommend daily broad spectrum sunscreen SPF 30+ to sun-exposed areas, reapply every 2 hours as needed. ?- Call for any changes ? ?Return if symptoms worsen or fail to improve. ? ?Graciella Belton, RMA, am acting as scribe for Brendolyn Patty, MD . ? ?Documentation: I have reviewed the above documentation for accuracy and completeness, and I agree with the above. ? ?Brendolyn Patty MD  ? ?

## 2022-04-07 NOTE — Patient Instructions (Addendum)
Cryotherapy Aftercare ? ?Wash gently with soap and water everyday.   ?Apply Vaseline and Band-Aid daily until healed.  ? ? ?Recommend daily broad spectrum sunscreen SPF 30+ to sun-exposed areas, reapply every 2 hours as needed. Call for new or changing lesions.  ?Staying in the shade or wearing long sleeves, sun glasses (UVA+UVB protection) and wide brim hats (4-inch brim around the entire circumference of the hat) are also recommended for sun protection.  ? ?If You Need Anything After Your Visit ? ?If you have any questions or concerns for your doctor, please call our main line at [phone] and press option 4 to reach your doctor's medical assistant. If no one answers, please leave a voicemail as directed and we will return your call as soon as possible. Messages left after 4 pm will be answered the following business day.  ? ?You may also send [place] a message via MyChart. We typically respond to MyChart messages within 1-2 business days. ? ?For prescription refills, please ask your pharmacy to contact our office. Our fax number is [phone]. ? ?If you have an urgent issue when the clinic is closed that cannot wait until the next business day, you can page your doctor at the number below.   ? ?Please note that while we do our best to be available for urgent issues outside of office hours, we are not available 24/7.  ? ?If you have an urgent issue and are unable to reach [place], you may choose to seek medical care at your doctor's office, retail clinic, urgent care center, or emergency room. ? ?If you have a medical emergency, please immediately call 911 or go to the emergency department. ? ?Pager Numbers ? ?- Dr. Nehemiah Massed: [phone] ? ?- Dr. Laurence Ferrari: [phone] ? ?- Dr. Nicole Kindred: [phone] ? ?In the event of inclement weather, please call our main line at [phone] for an update on the status of any delays or closures. ? ?Dermatology Medication Tips: ?Please keep the boxes that topical medications come in in  order to help keep track of the instructions about where and how to use these. Pharmacies typically print the medication instructions only on the boxes and not directly on the medication tubes.  ? ?If your medication is too expensive, please contact our office at [phone] option 4 or send [place] a message through [place].  ? ?We are unable to tell what your co-pay for medications will be in advance as this is different depending on your insurance coverage. However, we may be able to find a substitute medication at lower cost or fill out paperwork to get insurance to cover a needed medication.  ? ?If a prior authorization is required to get your medication covered by your insurance company, please allow [place] 1-2 business days to complete this process. ? ?Drug prices often vary depending on where the prescription is filled and some pharmacies may offer cheaper prices. ? ?The website www.goodrx.com contains coupons for medications through different pharmacies. The prices here do not account for what the cost may be with help from insurance (it may be cheaper with your insurance), but the website can give you the price if you did not use any insurance.  ?- You can print the associated coupon and take it with your prescription to the pharmacy.  ?- You may also stop by our office during regular business hours and pick up a GoodRx coupon card.  ?- If you need your prescription sent electronically to a different pharmacy, notify our office through  [place] or by phone at [phone] option 4. ? ? ? ? ?Si Usted Necesita Algo Despu?s de Su Visita ? ?Tambi?n puede enviarnos un mensaje a trav?s de MyChart. Por lo general respondemos a los mensajes de MyChart en el transcurso de 1 a 2 d?as h?biles. ? ?Para renovar recetas, por favor pida a su farmacia que se ponga en contacto con nuestra oficina. Nuestro n?mero de fax es el [phone]. ? ?Si tiene un asunto urgente cuando la cl?nica est? cerrada y que no puede  esperar hasta el siguiente d?a h?bil, puede llamar/localizar a su doctor(a) al n?mero que aparece a continuaci?n.  ? ?Por favor, tenga en cuenta que aunque hacemos todo lo posible para estar disponibles para asuntos urgentes fuera del horario de oficina, no estamos disponibles las 24 horas del d?a, los 7 d?as de la semana.  ? ?Si tiene un problema urgente y no puede comunicarse con nosotros, puede optar por buscar atenci?n m?dica  en el consultorio de su doctor(a), en una cl?nica privada, en un centro de atenci?n urgente o en una sala de emergencias. ? ?Si tiene Engineer, maintenance (IT) m?dica, por favor llame inmediatamente al 911 o vaya a la sala de emergencias. ? ?N?meros de b?per ? ?- Dr. Nehemiah Massed: [phone] ? ?- Dra. Moye: [phone] ? ?- Dra. Nicole Kindred: [phone] ? ?En caso de inclemencias del tiempo, por favor llame a nuestra l?nea principal al [phone] para una actualizaci?n sobre el estado de cualquier retraso o cierre. ? ?Consejos para la medicaci?n en dermatolog?a: ?Por favor, guarde las cajas en las que vienen los medicamentos de uso t?pico para ayudarle a seguir las instrucciones sobre d?nde y c?mo usarlos. Las farmacias generalmente imprimen las instrucciones del medicamento s?lo en las cajas y no directamente en los tubos del [place].  ? ?Si su medicamento es muy caro, por favor, p?ngase en contacto con Zigmund Daniel llamando al [phone] y presione la opci?n 4 o env?enos un mensaje a trav?s de MyChart.  ? ?No podemos decirle cu?l ser? su copago por los medicamentos por adelantado ya que esto es diferente dependiendo de la cobertura de su seguro. Sin embargo, es posible que podamos encontrar un medicamento sustituto a Electrical engineer un formulario para que el seguro cubra el medicamento que se considera necesario.  ? ?Si se requiere Ardelia Mems autorizaci?n previa para que su compa??a de seguros [place] su medicamento, por favor perm?tanos de 1 a 2 d?as h?biles para completar este proceso. ? ?Los  precios de los medicamentos var?an con frecuencia dependiendo del Environmental consultant de d?nde se surte la receta y alguna farmacias pueden ofrecer precios m?s baratos. ? ?El sitio web www.goodrx.com tiene cupones para medicamentos de Airline pilot. Los precios aqu? no tienen en cuenta lo que podr?a costar con la ayuda del seguro (puede ser m?s barato con su seguro), pero el sitio web puede darle el precio si no utiliz? ning?n seguro.  ?- Puede imprimir el cup?n correspondiente y llevarlo con su receta a la farmacia.  ?- Tambi?n puede pasar por nuestra oficina durante el horario de atenci?n regular y recoger una tarjeta de cupones de GoodRx.  ?- Si necesita que su receta se env?e electr?nicamente a Chiropodist, informe a nuestra oficina a trav?s de MyChart de [place] o por tel?fono llamando al [phone] y presione la opci?n 4. ? ?

## 2022-04-21 IMAGING — MG MM DIGITAL SCREENING BILAT W/ TOMO AND CAD
8 series · 8 of 24 positions shown · non-contrast
Comparison: Previous exam(s).

CLINICAL DATA: Screening.

EXAM:
DIGITAL SCREENING BILATERAL MAMMOGRAM WITH TOMOSYNTHESIS AND CAD
TECHNIQUE: Bilateral screening digital craniocaudal and mediolateral oblique
mammograms were obtained. Bilateral screening digital breast
tomosynthesis was performed. The images were evaluated with
computer-aided detection.

[R MLO synth-2D]
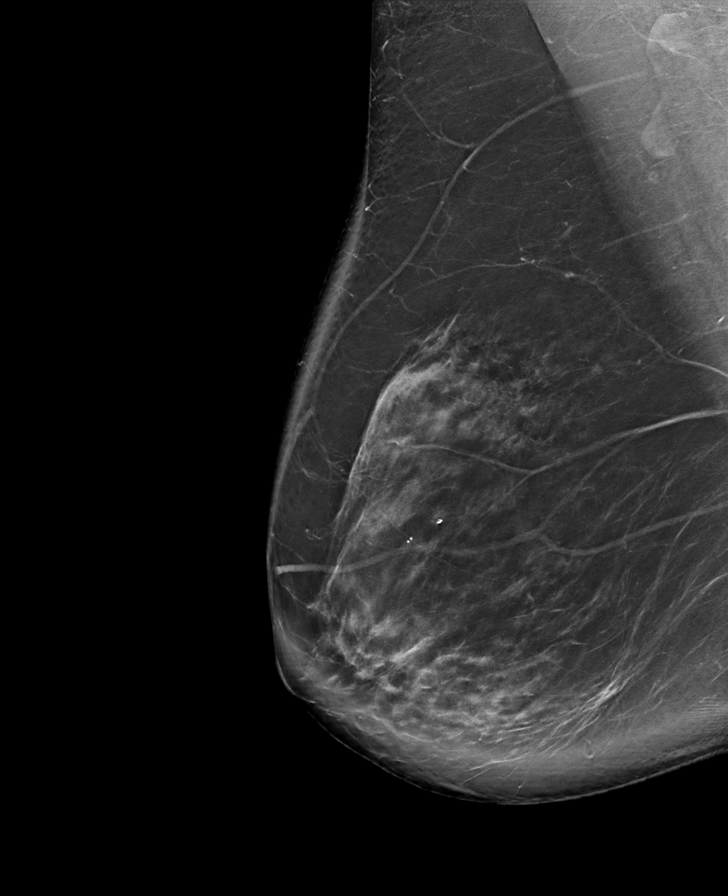

[L CC synth-2D]
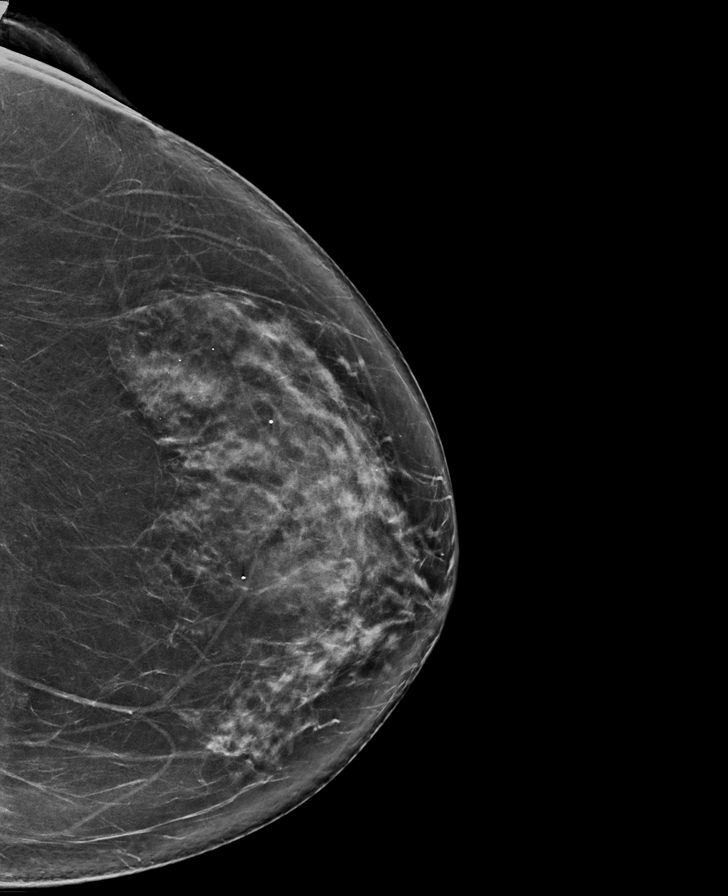

[R CC synth-2D]
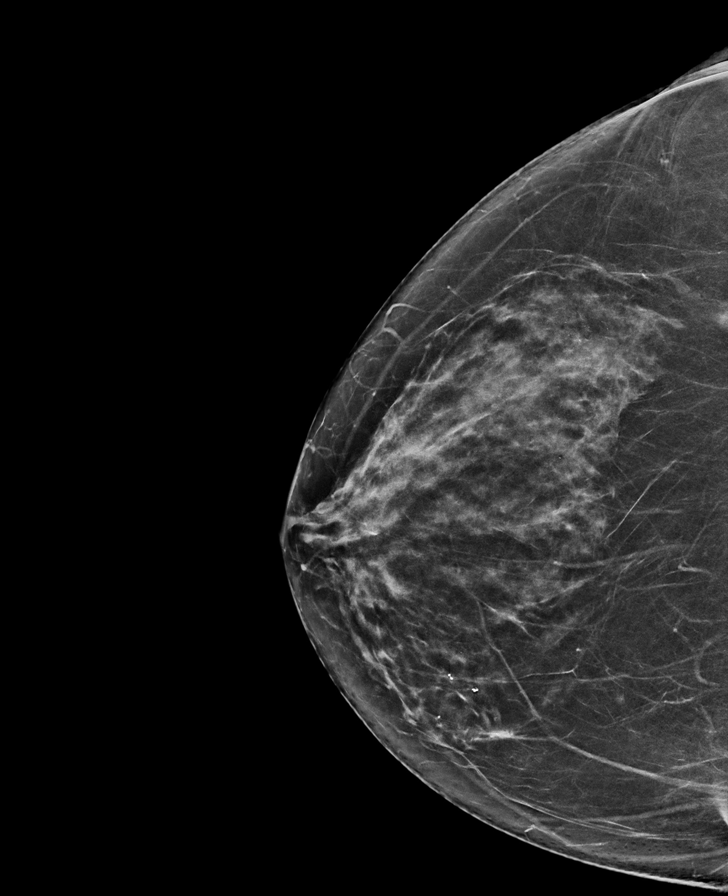

[L MLO synth-2D]
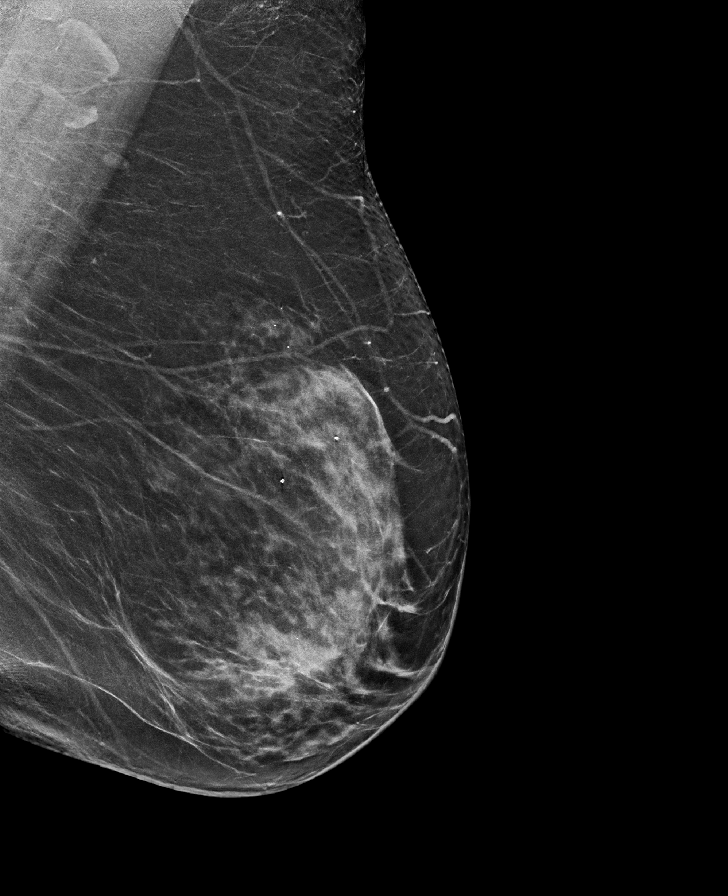

[R MLO tomo · tomo slice 64/127.0]
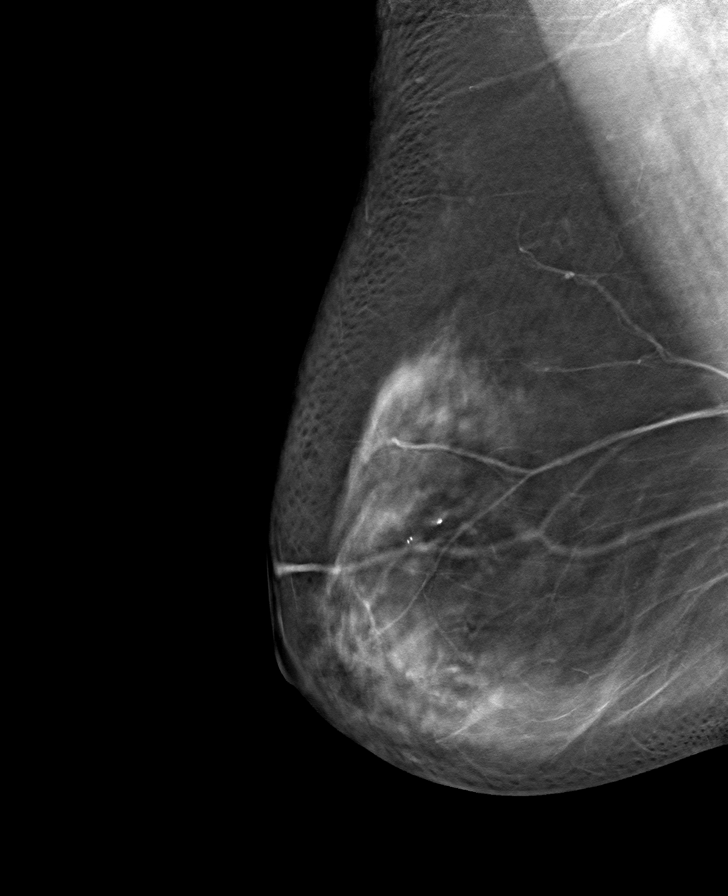

[R CC tomo · tomo slice 35/70.0]
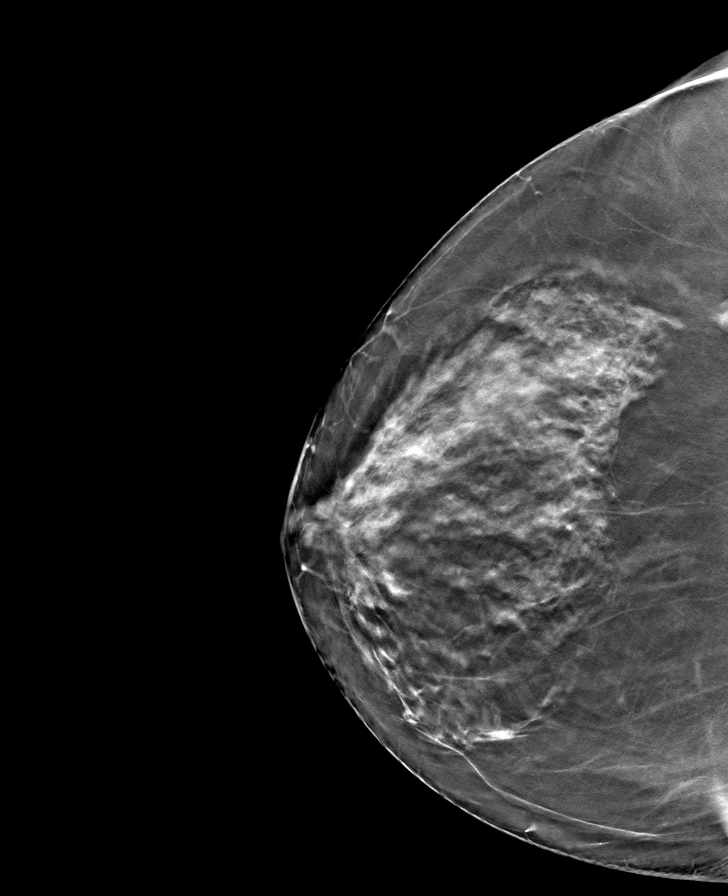

[L MLO tomo · tomo slice 39/77.0]
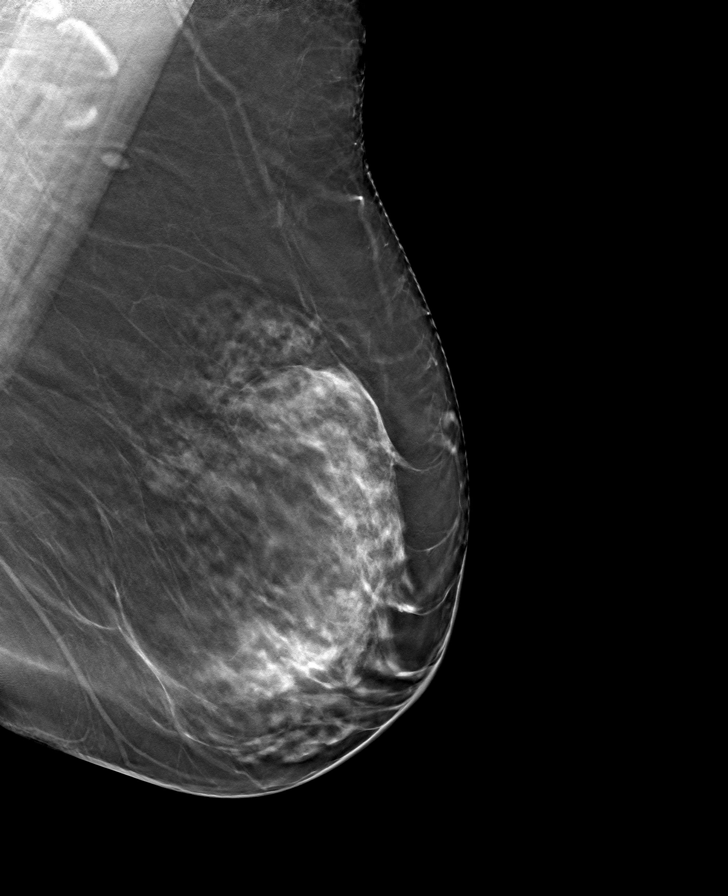

[L CC tomo · tomo slice 41/81.0]
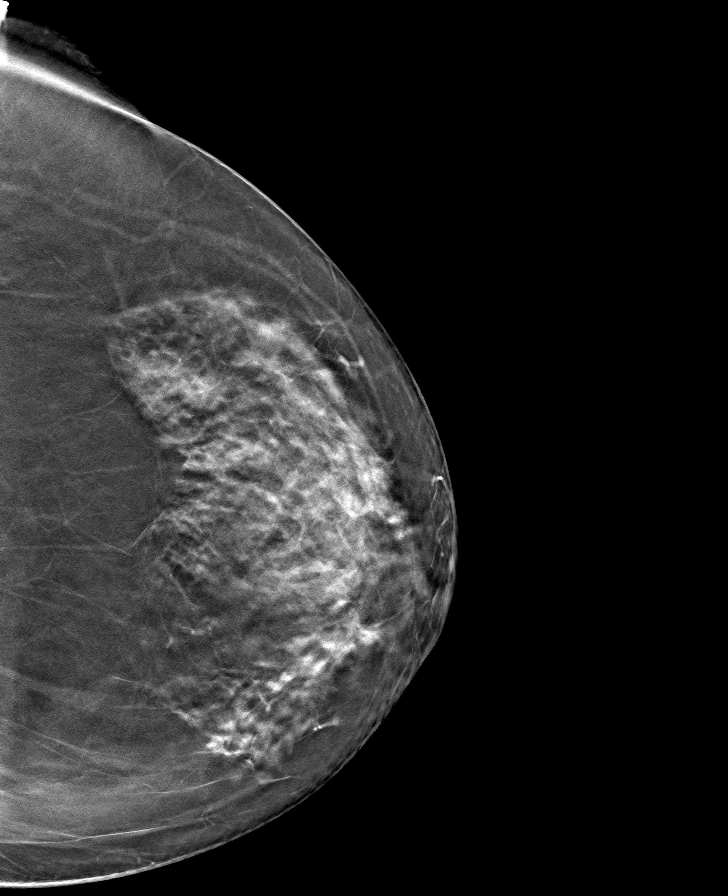

[8 of 24 positions shown; findings below may reference images not displayed]

ACR Breast Density Category c: The breast tissue is heterogeneously
dense, which may obscure small masses.
FINDINGS: There are no findings suspicious for malignancy.
IMPRESSION: No mammographic evidence of malignancy. A result letter of this
screening mammogram will be mailed directly to the patient.

RECOMMENDATION:
Screening mammogram in one year. (Code:Q3-W-BC3)

BI-RADS CATEGORY  1: Negative.

## 2022-04-22 ENCOUNTER — Encounter: Payer: BC Managed Care – PPO | Admitting: Dermatology

## 2022-06-02 ENCOUNTER — Ambulatory Visit (INDEPENDENT_AMBULATORY_CARE_PROVIDER_SITE_OTHER): Payer: Medicare Other | Admitting: Dermatology

## 2022-06-02 DIAGNOSIS — L570 Actinic keratosis: Secondary | ICD-10-CM | POA: Diagnosis not present

## 2022-06-02 DIAGNOSIS — L820 Inflamed seborrheic keratosis: Secondary | ICD-10-CM

## 2022-06-02 DIAGNOSIS — D229 Melanocytic nevi, unspecified: Secondary | ICD-10-CM | POA: Diagnosis not present

## 2022-06-02 DIAGNOSIS — D1800 Hemangioma unspecified site: Secondary | ICD-10-CM

## 2022-06-02 DIAGNOSIS — L578 Other skin changes due to chronic exposure to nonionizing radiation: Secondary | ICD-10-CM

## 2022-06-02 DIAGNOSIS — R21 Rash and other nonspecific skin eruption: Secondary | ICD-10-CM

## 2022-06-02 DIAGNOSIS — L821 Other seborrheic keratosis: Secondary | ICD-10-CM

## 2022-06-02 DIAGNOSIS — L814 Other melanin hyperpigmentation: Secondary | ICD-10-CM

## 2022-06-02 DIAGNOSIS — Z1283 Encounter for screening for malignant neoplasm of skin: Secondary | ICD-10-CM | POA: Diagnosis not present

## 2022-06-02 MED ORDER — TRIAMCINOLONE ACETONIDE 0.025 % EX CREA: 1.0000 | TOPICAL_CREAM | Freq: Two times a day (BID) | CUTANEOUS | 0 refills | Status: DC

## 2022-06-02 NOTE — Patient Instructions (Addendum)
Cryotherapy Aftercare  Wash gently with soap and water everyday.   Apply Vaseline and Band-Aid daily until healed.   Recommend taking Heliocare sun protection supplement daily in sunny weather for additional sun protection. For maximum protection on the sunniest days, you can take up to 2 capsules of regular Heliocare OR take 1 capsule of Heliocare Ultra. For prolonged exposure (such as a full day in the sun), you can repeat your dose of the supplement 4 hours after your first dose. Heliocare can be purchased at [facility], at some [facility] or at www.heliocare.com.    Melanoma ABCDEs  Melanoma is the most dangerous type of skin cancer, and is the leading cause of death from skin disease.  You are more likely to develop melanoma if you: Have light-colored skin, light-colored eyes, or red or blond hair Spend a lot of time in the sun Tan regularly, either outdoors or in a tanning bed Have had blistering sunburns, especially during childhood Have a close family member who has had a melanoma Have atypical moles or large birthmarks  Early detection of melanoma is key since treatment is typically straightforward and cure rates are extremely high if we catch it early.   The first sign of melanoma is often a change in a mole or a new dark spot.  The ABCDE system is a way of remembering the signs of melanoma.  A for asymmetry:  The two halves do not match. B for border:  The edges of the growth are irregular. C for color:  A mixture of colors are present instead of an even brown color. D for diameter:  Melanomas are usually (but not always) greater than 6mm - the size of a pencil eraser. E for evolution:  The spot keeps changing in size, shape, and color.  Please check your skin once per month between visits. You can use a small mirror in front and a large mirror behind you to keep an eye on the back side or your body.   If you see any new or changing lesions before your next follow-up,  please call to schedule a visit.  Please continue daily skin protection including broad spectrum sunscreen SPF 30+ to sun-exposed areas, reapplying every 2 hours as needed when you're outdoors.    Due to recent changes in healthcare laws, you may see results of your pathology and/or laboratory studies on MyChart before the doctors have had a chance to review them. We understand that in some cases there may be results that are confusing or concerning to you. Please understand that not all results are received at the same time and often the doctors may need to interpret multiple results in order to provide you with the best plan of care or course of treatment. Therefore, we ask that you please give us 2 business days to thoroughly review all your results before contacting the office for clarification. Should we see a critical lab result, you will be contacted sooner.   If You Need Anything After Your Visit  If you have any questions or concerns for your doctor, please call our main line at [phone] and press option 4 to reach your doctor's medical assistant. If no one answers, please leave a voicemail as directed and we will return your call as soon as possible. Messages left after 4 pm will be answered the following business day.   You may also send us a message via MyChart. We typically respond to MyChart messages within 1-2 business days.    For prescription refills, please ask your pharmacy to contact our office. Our fax number is [phone].  If you have an urgent issue when the clinic is closed that cannot wait until the next business day, you can page your doctor at the number below.    Please note that while we do our best to be available for urgent issues outside of office hours, we are not available 24/7.   If you have an urgent issue and are unable to reach us, you may choose to seek medical care at your doctor's office, retail clinic, urgent care center, or emergency room.  If you  have a medical emergency, please immediately call 911 or go to the emergency department.  Pager Numbers  - Dr. Kowalski: [phone]  - Dr. Moye: [phone]  - Dr. Stewart: [phone]  In the event of inclement weather, please call our main line at [phone] for an update on the status of any delays or closures.  Dermatology Medication Tips: Please keep the boxes that topical medications come in in order to help keep track of the instructions about where and how to use these. Pharmacies typically print the medication instructions only on the boxes and not directly on the medication tubes.   If your medication is too expensive, please contact our office at [phone] option 4 or send us a message through MyChart.   We are unable to tell what your co-pay for medications will be in advance as this is different depending on your insurance coverage. However, we may be able to find a substitute medication at lower cost or fill out paperwork to get insurance to cover a needed medication.   If a prior authorization is required to get your medication covered by your insurance company, please allow us 1-2 business days to complete this process.  Drug prices often vary depending on where the prescription is filled and some pharmacies may offer cheaper prices.  The website www.goodrx.com contains coupons for medications through different pharmacies. The prices here do not account for what the cost may be with help from insurance (it may be cheaper with your insurance), but the website can give you the price if you did not use any insurance.  - You can print the associated coupon and take it with your prescription to the pharmacy.  - You may also stop by our office during regular business hours and pick up a GoodRx coupon card.  - If you need your prescription sent electronically to a different pharmacy, notify our office through [place] MyChart or by phone at [phone] option  4.     Si Usted Necesita Algo Despus de Su Visita  Tambin puede enviarnos un mensaje a travs de MyChart. Por lo general respondemos a los mensajes de MyChart en el transcurso de 1 a 2 das hbiles.  Para renovar recetas, por favor pida a su farmacia que se ponga en contacto con nuestra oficina. Nuestro nmero de fax es el [phone].  Si tiene un asunto urgente cuando la clnica est cerrada y que no puede esperar hasta el siguiente da hbil, puede llamar/localizar a su doctor(a) al nmero que aparece a continuacin.   Por favor, tenga en cuenta que aunque hacemos todo lo posible para estar disponibles para asuntos urgentes fuera del horario de oficina, no estamos disponibles las 24 horas del da, los 7 das de la semana.   Si tiene un problema urgente y no puede comunicarse con nosotros, puede optar por buscar atencin mdica  en   el consultorio de su doctor(a), en una clnica privada, en un centro de atencin urgente o en una sala de emergencias.  Si tiene una emergencia mdica, por favor llame inmediatamente al 911 o vaya a la sala de emergencias.  Nmeros de bper  - Dr. Kowalski: [phone]  - Dra. Moye: [phone]  - Dra. Stewart: [phone]  En caso de inclemencias del tiempo, por favor llame a nuestra lnea principal al [phone] para una actualizacin sobre el estado de cualquier retraso o cierre.  Consejos para la medicacin en dermatologa: Por favor, guarde las cajas en las que vienen los medicamentos de uso tpico para ayudarle a seguir las instrucciones sobre dnde y cmo usarlos. Las farmacias generalmente imprimen las instrucciones del medicamento slo en las cajas y no directamente en los tubos del medicamento.   Si su medicamento es muy caro, por favor, pngase en contacto con nuestra oficina llamando al [phone] y presione la opcin 4 o envenos un mensaje a travs de MyChart.   No podemos decirle cul ser su copago por los medicamentos por  adelantado ya que esto es diferente dependiendo de la cobertura de su seguro. Sin embargo, es posible que podamos encontrar un medicamento sustituto a menor costo o llenar un formulario para que el seguro cubra el medicamento que se considera necesario.   Si se requiere una autorizacin previa para que su compaa de seguros cubra su medicamento, por favor permtanos de 1 a 2 das hbiles para completar este proceso.  Los precios de los medicamentos varan con frecuencia dependiendo del lugar de dnde se surte la receta y alguna farmacias pueden ofrecer precios ms baratos.  El sitio web www.goodrx.com tiene cupones para medicamentos de diferentes farmacias. Los precios aqu no tienen en cuenta lo que podra costar con la ayuda del seguro (puede ser ms barato con su seguro), pero el sitio web puede darle el precio si no utiliz ningn seguro.  - Puede imprimir el cupn correspondiente y llevarlo con su receta a la farmacia.  - Tambin puede pasar por nuestra oficina durante el horario de atencin regular y recoger una tarjeta de cupones de GoodRx.  - Si necesita que su receta se enve electrnicamente a una farmacia diferente, informe a nuestra oficina a travs de MyChart de [place] o por telfono llamando al [phone] y presione la opcin 4.  

## 2022-06-02 NOTE — Progress Notes (Signed)
Follow-Up Visit   Subjective  [name] is a 66 y.o. female who presents for the following: Annual Exam (The patient presents for Total-Body Skin Exam (TBSE) for skin cancer screening and mole check.  The patient has spots, moles and lesions to be evaluated, some may be new or changing and the patient has concerns that these could be cancer. Patient with hx of AK's. ).  Patient does have a spot at left inframammary that is irritated. She also has a rash at left nose and forehead where she may have gotten poison ivy working in the garden last weekend.   The following portions of the chart were reviewed this encounter and updated as appropriate:   Tobacco  Allergies  Meds  Problems  Med Hx  Surg Hx  Fam Hx      Review of Systems:  No other skin or systemic complaints except as noted in HPI or Assessment and Plan.  Objective  Well appearing patient in no apparent distress; mood and affect are within normal limits.  A full examination was performed including scalp, head, eyes, ears, nose, lips, neck, chest, axillae, abdomen, back, buttocks, bilateral upper extremities, bilateral lower extremities, hands, feet, fingers, toes, fingernails, and toenails. All findings within normal limits unless otherwise noted below.  forehead, nose Linear pink plaque with vesiculation   Left Forearm x 1, left pretibia x 2 (3) Erythematous thin papules/macules with gritty scale.   Left Inframammary Fold Erythematous stuck-on, waxy papule or plaque    Assessment & Plan  Rash forehead, nose  C/w allergic contact dermatitis  Start TMC 0.025% cream twice daily up to 1 week as needed for itch/rash.  Topical steroids (such as triamcinolone, fluocinolone, fluocinonide, mometasone, clobetasol, halobetasol, betamethasone, hydrocortisone) can cause thinning and lightening of the skin if they are used for too long in the same area. Your physician has selected the right strength medicine for  your problem and area affected on the body. Please use your medication only as directed by your physician to prevent side effects.     triamcinolone (KENALOG) 0.025 % cream - forehead, nose Apply 1 Application topically 2 (two) times daily. for up to 1 week to affected areas as needed for rash/itch  AK (actinic keratosis) (3) Left Forearm x 1, left pretibia x 2  Actinic keratoses are precancerous spots that appear secondary to cumulative UV radiation exposure/sun exposure over time. They are chronic with expected duration over 1 year. A portion of actinic keratoses will progress to squamous cell carcinoma of the skin. It is not possible to reliably predict which spots will progress to skin cancer and so treatment is recommended to prevent development of skin cancer.  Recommend daily broad spectrum sunscreen SPF 30+ to sun-exposed areas, reapply every 2 hours as needed.  Recommend staying in the shade or wearing long sleeves, sun glasses (UVA+UVB protection) and wide brim hats (4-inch brim around the entire circumference of the hat). Call for new or changing lesions.  Prior to procedure, discussed risks of blister formation, small wound, skin dyspigmentation, or rare scar following cryotherapy. Recommend Vaseline ointment to treated areas while healing.   Destruction of lesion - Left Forearm x 1, left pretibia x 2  Destruction method: cryotherapy   Informed consent: discussed and consent obtained   Lesion destroyed using liquid nitrogen: Yes   Cryotherapy cycles:  2 Outcome: patient tolerated procedure well with no complications   Post-procedure details: wound care instructions given    Inflamed seborrheic keratosis Left  Inframammary Fold  Symptomatic, irritating, patient would like treated.  Prior to procedure, discussed risks of blister formation, small wound, skin dyspigmentation, or rare scar following cryotherapy. Recommend Vaseline ointment to treated areas while  healing.   Destruction of lesion - Left Inframammary Fold  Destruction method: cryotherapy   Informed consent: discussed and consent obtained   Lesion destroyed using liquid nitrogen: Yes   Cryotherapy cycles:  2 Outcome: patient tolerated procedure well with no complications   Post-procedure details: wound care instructions given     Lentigines - Scattered tan macules - Due to sun exposure - Benign-appearing, observe - Recommend daily broad spectrum sunscreen SPF 30+ to sun-exposed areas, reapply every 2 hours as needed. - Call for any changes  Seborrheic Keratoses - Stuck-on, waxy, tan-brown papules and/or plaques  - Benign-appearing - Discussed benign etiology and prognosis. - Observe - Call for any changes  Melanocytic Nevi - Tan-brown and/or pink-flesh-colored symmetric macules and papules - Benign appearing on exam today - Observation - Call clinic for new or changing moles - Recommend daily use of broad spectrum spf 30+ sunscreen to sun-exposed areas.   Hemangiomas - Red papules - Discussed benign nature - Observe - Call for any changes  Actinic Damage - Chronic condition, secondary to cumulative UV/sun exposure - diffuse scaly erythematous macules with underlying dyspigmentation - Recommend daily broad spectrum sunscreen SPF 30+ to sun-exposed areas, reapply every 2 hours as needed.  - Staying in the shade or wearing long sleeves, sun glasses (UVA+UVB protection) and wide brim hats (4-inch brim around the entire circumference of the hat) are also recommended for sun protection.  - Call for new or changing lesions.  Skin cancer screening performed today.  Return in about 1 year (around 06/03/2023) for TBSE.  Graciella Belton, RMA, am acting as scribe for Forest Gleason, MD .  Documentation: I have reviewed the above documentation for accuracy and completeness, and I agree with the above.  Forest Gleason, MD

## 2022-06-08 ENCOUNTER — Encounter: Payer: Self-pay | Admitting: Dermatology

## 2022-06-29 ENCOUNTER — Ambulatory Visit: Payer: Medicare Other | Admitting: Dermatology

## 2022-07-08 ENCOUNTER — Ambulatory Visit (INDEPENDENT_AMBULATORY_CARE_PROVIDER_SITE_OTHER): Payer: Medicare Other | Admitting: Dermatology

## 2022-07-08 DIAGNOSIS — L304 Erythema intertrigo: Secondary | ICD-10-CM

## 2022-07-08 MED ORDER — HYDROCORTISONE 2.5 % EX CREA: TOPICAL_CREAM | Freq: Two times a day (BID) | CUTANEOUS | 1 refills | Status: DC | PRN

## 2022-07-08 NOTE — Patient Instructions (Addendum)
Continue ketoconazole 2% cream twice daily, follow with skin protectant/diaper paste as needed. (Desitin or Triple Paste) Recommend Zeasorb AF powder or Gold Bond daily once clear. If recurs, restart ketoconazle 2% cream twice daily and start HC 2.5% cream twice daily for up to 1 week.   Due to recent changes in healthcare laws, you may see results of your pathology and/or laboratory studies on MyChart before the doctors have had a chance to review them. We understand that in some cases there may be results that are confusing or concerning to you. Please understand that not all results are received at the same time and often the doctors may need to interpret multiple results in order to provide you with the best plan of care or course of treatment. Therefore, we ask that you please give [place] 2 business days to thoroughly review all your results before contacting the office for clarification. Should we see a critical lab result, you will be contacted sooner.   If You Need Anything After Your Visit  If you have any questions or concerns for your doctor, please call our main line at [phone] and press option 4 to reach your doctor's medical assistant. If no one answers, please leave a voicemail as directed and we will return your call as soon as possible. Messages left after 4 pm will be answered the following business day.   You may also send [place] a message via [place]. We typically respond to MyChart messages within 1-2 business days.  For prescription refills, please ask your pharmacy to contact our office. Our fax number is [phone].  If you have an urgent issue when the clinic is closed that cannot wait until the next business day, you can page your doctor at the number below.    Please note that while we do our best to be available for urgent issues outside of office hours, we are not available 24/7.   If you have an urgent issue and are unable to reach [place], you may choose to seek medical  care at your doctor's office, retail clinic, urgent care center, or emergency room.  If you have a medical emergency, please immediately call 911 or go to the emergency department.  Pager Numbers  - Dr. Nehemiah Massed: [phone]  - Dr. Laurence Ferrari: [phone]  - Dr. Nicole Kindred: [phone]  In the event of inclement weather, please call our main line at [phone] for an update on the status of any delays or closures.  Dermatology Medication Tips: Please keep the boxes that topical medications come in in order to help keep track of the instructions about where and how to use these. Pharmacies typically print the medication instructions only on the boxes and not directly on the medication tubes.   If your medication is too expensive, please contact our office at [phone] option 4 or send [place] a message through [place].   We are unable to tell what your co-pay for medications will be in advance as this is different depending on your insurance coverage. However, we may be able to find a substitute medication at lower cost or fill out paperwork to get insurance to cover a needed medication.   If a prior authorization is required to get your medication covered by your insurance company, please allow [place] 1-2 business days to complete this process.  Drug prices often vary depending on where the prescription is filled and some pharmacies may offer cheaper prices.  The website www.goodrx.com contains coupons for medications through different pharmacies. The  prices here do not account for what the cost may be with help from insurance (it may be cheaper with your insurance), but the website can give you the price if you did not use any insurance.  - You can print the associated coupon and take it with your prescription to the pharmacy.  - You may also stop by our office during regular business hours and pick up a GoodRx coupon card.  - If you need your prescription sent electronically to a different  pharmacy, notify our office through [facility] or by phone at [phone] option 4.     Si Usted Necesita Algo Despus de Su Visita  Tambin puede enviarnos un mensaje a travs de Pharmacist, community. Por lo general respondemos a los mensajes de MyChart en el transcurso de 1 a 2 das hbiles.  Para renovar recetas, por favor pida a su farmacia que se ponga en contacto con nuestra oficina. Harland Dingwall de fax es [place] [phone].  Si tiene un asunto urgente cuando la clnica est cerrada y que no puede esperar hasta el siguiente da hbil, puede llamar/localizar a su doctor(a) al nmero que aparece a continuacin.   Por favor, tenga en cuenta que aunque hacemos todo lo posible para estar disponibles para asuntos urgentes fuera del horario de [place], no estamos disponibles las 24 horas del da, los 7 das de la [place].   Si tiene un problema urgente y no puede comunicarse con nosotros, puede optar por buscar atencin mdica  en el consultorio de su doctor(a), en una clnica privada, en un centro de atencin urgente o en una sala de emergencias.  Si tiene Engineering geologist, por favor llame inmediatamente al 911 o vaya a la sala de emergencias.  Nmeros de bper  - Dr. Nehemiah Massed: [phone]  - Dra. Moye: [phone]  - Dra. Nicole Kindred: [phone]  En caso de inclemencias del [place], por favor llame a Johnsie Kindred principal al [phone] para una actualizacin sobre el [place] de cualquier retraso o cierre.  Consejos para la medicacin en dermatologa: Por favor, guarde las cajas en las que vienen los medicamentos de uso tpico para ayudarle a seguir las instrucciones sobre dnde y cmo usarlos. Las farmacias generalmente imprimen las instrucciones del medicamento slo en las cajas y no directamente en los tubos del [place].   Si su medicamento es muy caro, por favor, pngase en contacto con Zigmund Daniel llamando al [phone] y presione la opcin 4 o envenos un mensaje a  travs de Pharmacist, community.   No podemos decirle cul ser su copago por los medicamentos por adelantado ya que esto es diferente dependiendo de la cobertura de su seguro. Sin embargo, es posible que podamos encontrar un medicamento sustituto a Electrical engineer un formulario para que el seguro cubra el medicamento que se considera necesario.   Si se requiere una autorizacin previa para que su compaa de seguros [place] su medicamento, por favor permtanos de 1 a 2 das hbiles para completar este proceso.  Los precios de los medicamentos varan con frecuencia dependiendo del Environmental consultant de dnde se surte la receta y alguna farmacias pueden ofrecer precios ms baratos.  El sitio web www.goodrx.com tiene cupones para medicamentos de Airline pilot. Los precios aqu no tienen en cuenta lo que podra costar con la ayuda del seguro (puede ser ms barato con su seguro), pero el sitio web puede darle el precio si no utiliz Research scientist (physical sciences).  - Puede imprimir el cupn correspondiente y llevarlo con su receta a  la farmacia.  - Tambin puede pasar por nuestra oficina durante el horario de atencin regular y Charity fundraiser una tarjeta de cupones de GoodRx.  - Si necesita que su receta se enve electrnicamente a una farmacia diferente, informe a nuestra oficina a travs de MyChart de [place] o por telfono llamando al [phone] y presione la opcin 4.

## 2022-07-08 NOTE — Progress Notes (Signed)
   Follow-Up Visit   Subjective  [name] is a 66 y.o. female who presents for the following: Rash (Patient here today for very itchy rash at groin. Started July 19th and went to [facility] 7/24. She was given ketoconazole 2% cream to use twice daily and TMC 0.1% cream to use as needed for itch. Itch and rash has improved but not clear. ).  The following portions of the chart were reviewed this encounter and updated as appropriate:   Tobacco  Allergies  Meds  Problems  Med Hx  Surg Hx  Fam Hx      Review of Systems:  No other skin or systemic complaints except as noted in HPI or Assessment and Plan.  Objective  Well appearing patient in no apparent distress; mood and affect are within normal limits.  A focused examination was performed including lower abdomen. Relevant physical exam findings are noted in the Assessment and Plan.  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 Condition is symptomatic/ bothersome to patient. Not currently at goal.  Intertrigo is a chronic recurrent rash that occurs in skin fold areas that may be associated with friction; heat; moisture; yeast; fungus; and bacteria.  It is exacerbated by increased movement / activity; sweating; and higher atmospheric temperature.  Continue ketoconazole 2% cream twice daily, follow with skin protectant/diaper paste as needed.  Recommend Zeasorb AF powder or Gold Bond daily once clear.  If recurs, restart ketoconazle 2% cream twice daily and start HC 2.5% cream twice daily for up to 1 week.   Avoid triamcinolone to this area due to risk of atrophy and striae   hydrocortisone 2.5 % cream - Left Suprapubic Area Apply topically 2 (two) times daily as needed (Rash). For up to 1 week as needed for flares   Return for as scheduled.  Graciella Belton, RMA,  am acting as scribe for Forest Gleason, MD .  Documentation: I have reviewed the above documentation for accuracy and completeness, and I agree with the above.  Forest Gleason, MD

## 2022-07-21 ENCOUNTER — Encounter: Payer: Self-pay | Admitting: Dermatology

## 2022-08-25 ENCOUNTER — Other Ambulatory Visit: Payer: Self-pay | Admitting: Internal Medicine

## 2022-08-25 DIAGNOSIS — Z1231 Encounter for screening mammogram for malignant neoplasm of breast: Secondary | ICD-10-CM

## 2022-10-13 ENCOUNTER — Ambulatory Visit
Admission: RE | Admit: 2022-10-13 | Discharge: 2022-10-13 | Disposition: A | Discharge status: Home / Self Care | Payer: Medicare Other | Source: Ambulatory Visit | Attending: Internal Medicine | Admitting: Internal Medicine

## 2022-10-13 DIAGNOSIS — Z1231 Encounter for screening mammogram for malignant neoplasm of breast: Secondary | ICD-10-CM | POA: Insufficient documentation

## 2023-06-08 ENCOUNTER — Encounter: Payer: Medicare Other | Admitting: Dermatology

## 2023-07-19 ENCOUNTER — Other Ambulatory Visit: Payer: Self-pay | Admitting: Family Medicine

## 2023-07-19 DIAGNOSIS — M5416 Radiculopathy, lumbar region: Secondary | ICD-10-CM

## 2023-07-19 DIAGNOSIS — M5136 Other intervertebral disc degeneration, lumbar region: Secondary | ICD-10-CM

## 2023-07-23 ENCOUNTER — Ambulatory Visit: Admission: RE | Admit: 2023-07-23 | Payer: Medicare Other | Source: Ambulatory Visit

## 2023-07-23 DIAGNOSIS — M5136 Other intervertebral disc degeneration, lumbar region: Secondary | ICD-10-CM

## 2023-07-23 DIAGNOSIS — M5416 Radiculopathy, lumbar region: Secondary | ICD-10-CM

## 2023-07-26 ENCOUNTER — Encounter: Payer: Medicare Other | Admitting: Dermatology

## 2023-08-22 ENCOUNTER — Ambulatory Visit: Payer: Medicare Other | Admitting: Dermatology

## 2023-09-08 DIAGNOSIS — K921 Melena: Secondary | ICD-10-CM | POA: Insufficient documentation

## 2023-09-08 DIAGNOSIS — K644 Residual hemorrhoidal skin tags: Secondary | ICD-10-CM | POA: Insufficient documentation

## 2023-09-08 DIAGNOSIS — R490 Dysphonia: Secondary | ICD-10-CM | POA: Insufficient documentation

## 2023-10-04 ENCOUNTER — Other Ambulatory Visit: Payer: Self-pay | Admitting: Internal Medicine

## 2023-10-04 DIAGNOSIS — Z1231 Encounter for screening mammogram for malignant neoplasm of breast: Secondary | ICD-10-CM

## 2023-10-20 ENCOUNTER — Ambulatory Visit
Admission: RE | Admit: 2023-10-20 | Discharge: 2023-10-20 | Disposition: A | Discharge status: Home / Self Care | Payer: Medicare Other | Source: Ambulatory Visit | Attending: Internal Medicine | Admitting: Internal Medicine

## 2023-10-20 DIAGNOSIS — Z1231 Encounter for screening mammogram for malignant neoplasm of breast: Secondary | ICD-10-CM | POA: Diagnosis present

## 2023-10-24 ENCOUNTER — Ambulatory Visit: Payer: Medicare Other

## 2023-10-24 DIAGNOSIS — K317 Polyp of stomach and duodenum: Secondary | ICD-10-CM | POA: Diagnosis not present

## 2023-10-24 DIAGNOSIS — K2270 Barrett's esophagus without dysplasia: Secondary | ICD-10-CM | POA: Diagnosis not present

## 2023-10-24 DIAGNOSIS — Z860101 Personal history of adenomatous and serrated colon polyps: Secondary | ICD-10-CM | POA: Diagnosis not present

## 2023-10-24 DIAGNOSIS — K641 Second degree hemorrhoids: Secondary | ICD-10-CM | POA: Diagnosis not present

## 2023-10-24 DIAGNOSIS — K449 Diaphragmatic hernia without obstruction or gangrene: Secondary | ICD-10-CM | POA: Diagnosis not present

## 2023-10-24 DIAGNOSIS — Z1211 Encounter for screening for malignant neoplasm of colon: Secondary | ICD-10-CM | POA: Diagnosis present

## 2023-10-24 DIAGNOSIS — D122 Benign neoplasm of ascending colon: Secondary | ICD-10-CM | POA: Diagnosis not present

## 2024-05-25 NOTE — ED Provider Notes (Signed)
 ED Attending Note   Patient seen as a team with Dr. Franchot and Rivero who has also contributed to this note.   History: [name] is a 68 year old female with a past medical history as below is presenting to the ED c/o right wrist pain s/p fall. Pt is from [place] and tripped over a curb and fell onto her right wrist extending it earlier today. Pt denies LOC or blood thinner use. Pt was able to ambulate after the fall.   Patient information was obtained primarily from the patient and past medical records History/Exam limitations: none Interpretor services: N\A  Past Medical History[1] Past Surgical History[2] Social History   Socioeconomic History  . Marital status: Widowed    Spouse name: Not on file  . Number of children: Not on file  . Years of education: Not on file  . Highest education level: Not on file  Occupational History  . Not on file  Tobacco Use  . Smoking status: Never  . Smokeless tobacco: Never  Vaping Use  . Vaping status: Never Used  Substance and Sexual Activity  . Alcohol use: Never  . Drug use: Never  . Sexual activity: Not on file  Other Topics Concern  . Not on file  Social History Narrative  . Not on file   Social Drivers of Health   Financial Resource Strain: Low Risk  (04/03/2024)   Received from [facility]   Overall Financial Resource Strain (CARDIA)   . Difficulty of Paying Living Expenses: Not hard at all  Food Insecurity: No Food Insecurity (04/03/2024)   Received from [facility]   Hunger Vital Sign   . Worried About Programme Researcher, Broadcasting/film/video in the Last Year: Never true   . Ran Out of Food in the Last Year: Never true  Transportation Needs: No Transportation Needs (04/03/2024)   Received from [facility]   [facility] - Transportation   . In the past 12 months, has lack of transportation kept you from medical appointments or from getting medications?: No   . Lack of Transportation  (Non-Medical): No  Stress: Not on file  Housing Stability: Low Risk  (04/03/2024)   Received from [facility]   Housing Stability Vital Sign   . Unable to Pay for Housing in the Last Year: No   . Number of Times Moved in the Last Year: 0   . Homeless in the Last Year: No    Review of Systems: Review of Systems  Constitutional:  Negative for chills, fever and malaise/fatigue.  HENT:  Negative for congestion, ear pain and sore throat.   Eyes:  Negative for blurred vision, pain and discharge.  Respiratory:  Negative for cough, shortness of breath and wheezing.   Cardiovascular:  Negative for chest pain and palpitations.  Gastrointestinal:  Negative for abdominal pain, diarrhea, nausea and vomiting.  Genitourinary:  Negative for dysuria, frequency and hematuria.  Musculoskeletal:  Positive for joint pain. Negative for back pain, myalgias and neck pain.       R wrist pain   Skin:  Negative for itching and rash.  Neurological:  Negative for dizziness, tingling, loss of consciousness, weakness and headaches.  Psychiatric/Behavioral:  Negative for depression and suicidal ideas.      No data found. Exam: Physical Exam Vitals and nursing note reviewed.  Constitutional:      General: She is not in acute distress. HENT:     Head: Normocephalic  and atraumatic.     Mouth/Throat:     Lips: Pink.     Mouth: Mucous membranes are moist.  Eyes:     Extraocular Movements: Extraocular movements intact.     Conjunctiva/sclera: Conjunctivae normal.     Pupils: Pupils are equal, round, and reactive to light.  Cardiovascular:     Rate and Rhythm: Normal rate and regular rhythm.     Pulses:          Radial pulses are 2+ on the right side and 2+ on the left side.       Dorsalis pedis pulses are 2+ on the right side and 2+ on the left side.     Heart sounds: No murmur heard.    No friction rub. No gallop.  Pulmonary:     Effort: Pulmonary effort is normal. No respiratory  distress.     Breath sounds: Normal breath sounds. No wheezing.  Abdominal:     General: Abdomen is flat. Bowel sounds are normal. There is no distension.     Palpations: Abdomen is soft.     Tenderness: There is no abdominal tenderness.  Musculoskeletal:        General: No deformity. Normal range of motion.     Cervical back: Normal range of motion and neck supple.     Comments: Mild TTP over r wrist. No anatomical snuff box TTP. No acute focal neurological deficits.   Skin:    General: Skin is warm and dry.     Findings: No bruising.  Neurological:     Mental Status: She is alert and oriented to person, place, and time.     Comments: MAE spontaneously and equally.  Psychiatric:        Mood and Affect: Mood normal.        Behavior: Behavior normal.     Medical Decision Making:  14. 68 year old female with above history brought for evaluation of right wrist pain s/p fall.    DDX: ICH vs fx vs dislocation vs soft tissue injury vs other  Plan: review imaging, place in wrist brace and dc with PCP follow up  See below for further orders/plan  Data Review: Amount and/or Complexity of Data Reviewed: Patient information was obtained primarily from the patient and past medical records  Social determinants of health: No - Limiting social determinants of health: None.     Amount and/or Complexity of Data Reviewed: - medical complexity: medical complexity, Triage notes and available nursing notes reviewed , Tests in the radiology section of CPT: ordered and independent interpretation, Independent visualization of images: yes, and Review and summarize past medical records: yes   - Monitoring: The patient's Cardiac Monitor Rhythm was interpreted by me.  The telemetry monitor showed NSR. This is interpreted as normal.  The patient's Oxygen Saturation Monitor was interpreted by me. The reading was 99%. The patient was on room air at the time of the reading. This is interpreted as  normal.  All Labs/Imaging/ECG, other diagnostics independently interpreted by me.   - LABS: Labs Reviewed - No data to display  - IMAGING: CT HEAD WO CONTRAST  Final Result  PROCEDURE:  CT HEAD WO CONTRAST, CT FACIAL BONES WO CONTRAST, CT CERVICAL  SPINE WO CONTRAST, DATE/TIME OF EXAM:  05/24/2024 8:21 PM, LOCATION  [facility]    INDICATION:  [email or web address] Casino, initial encounter    ADDITIONAL CLINICAL INFORMATION:  Ordering Provider Reason For Exam:  r/o fx, bleed (accession [id number]),  r/o fx (accession [id number])  Technologist Note:  Additional:      EXAMINATION:    1. Computed tomography (CT) of the head without contrast  2. CT of the maxillofacial bones, orbits, and paranasal sinuses without  contrast  3. CT of the cervical spine without contrast     TECHNIQUE: CT of the head, cervical spine, and maxillofacial bones,   orbits,  and paranasal sinuses was performed without contrast according to standard  protocol.    COMPARISON: No prior study is available for comparison at the time of this  dictation.    FINDINGS:    Head:    No acute intracranial hemorrhage or intra- or extra-axial fluid   collections  are identified. The ventricles are of normal size, shape, and morphology.  The basal cisterns are patent. No mass effect or midline shift is seen.   The  gray-white matter differentiation is normal. There is vascular  calcification of the carotid siphons.     No acute calvarial fracture is identified.    Maxillofacial:    No soft tissue abnormality is identified.     The orbits including the globes, optic nerves, retrobulbar fat and  extraocular muscles appear normal. The paranasal sinuses are clear. The  hard palate, mandible, and temporomandibular joints appear normal. The  mastoid air cells are clear.     Cervical spine:    No soft tissue abnormality is identified.     The cervical spine is straightened with loss of cervical  lordosis. The  prevertebral soft tissue is normal in thickness. The mineralization of the  bones is normal. Vertebral bodies are normal in height without evidence of  acute fracture. Other than middle atlantoaxial joint osteoarthritis, the  craniocervical junction appears normal. 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 The central canal is patent. There  are varying degrees of mild facet osteoarthritis. There are varying   degrees  of mild to moderate multilevel uncovertebral joint osteoarthritis with the  same degree of neural foraminal stenosis at these levels.      IMPRESSION:    1. No acute intracranial process.    2. No acute facial bone fractures identified.    3. No evidence of acute fracture in the cervical spine.    > Interpreting Provider: Pixie Call, MD on 05/24/2024 9:01 PM    CT CERVICAL SPINE WO CONTRAST  Final Result  PROCEDURE:  CT HEAD WO CONTRAST, CT FACIAL BONES WO CONTRAST, CT CERVICAL  SPINE WO CONTRAST, DATE/TIME OF EXAM:  05/24/2024 8:21 PM, LOCATION  [facility]    INDICATION:  [email or web address] Casino, initial encounter    ADDITIONAL CLINICAL INFORMATION:  Ordering Provider Reason For Exam:  r/o fx, bleed (accession [id number]),  r/o fx (accession [id number])  Technologist Note:  Additional:      EXAMINATION:    1. Computed tomography (CT) of the head without contrast  2. CT of the maxillofacial bones, orbits, and paranasal sinuses without  contrast  3. CT of the cervical spine without contrast     TECHNIQUE: CT of the head, cervical spine, and maxillofacial bones,   orbits,  and paranasal sinuses was performed without contrast according to standard  protocol.    COMPARISON: No prior study is available for comparison at the time of this  dictation.    FINDINGS:    Head:    No acute intracranial hemorrhage or intra- or extra-axial fluid   collections  are identified. The ventricles are of  normal size,  shape, and morphology.  The basal cisterns are patent. No mass effect or midline shift is seen.   The  gray-white matter differentiation is normal. There is vascular  calcification of the carotid siphons.     No acute calvarial fracture is identified.    Maxillofacial:    No soft tissue abnormality is identified.     The orbits including the globes, optic nerves, retrobulbar fat and  extraocular muscles appear normal. The paranasal sinuses are clear. The  hard palate, mandible, and temporomandibular joints appear normal. The  mastoid air cells are clear.     Cervical spine:    No soft tissue abnormality is identified.     The cervical spine is straightened with loss of cervical lordosis. The  prevertebral soft tissue is normal in thickness. The mineralization of the  bones is normal. Vertebral bodies are normal in height without evidence of  acute fracture. Other than middle atlantoaxial joint osteoarthritis, the  craniocervical junction appears normal. 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 The central canal is patent. There  are varying degrees of mild facet osteoarthritis. There are varying   degrees  of mild to moderate multilevel uncovertebral joint osteoarthritis with the  same degree of neural foraminal stenosis at these levels.      IMPRESSION:    1. No acute intracranial process.    2. No acute facial bone fractures identified.    3. No evidence of acute fracture in the cervical spine.    > Interpreting Provider: Pixie Call, MD on 05/24/2024 9:01 PM    CT Facial Bones Wo Contrast  Final Result  PROCEDURE:  CT HEAD WO CONTRAST, CT FACIAL BONES WO CONTRAST, CT CERVICAL  SPINE WO CONTRAST, DATE/TIME OF EXAM:  05/24/2024 8:21 PM, LOCATION  [facility]    INDICATION:  [email or web address] Casino, initial encounter    ADDITIONAL CLINICAL INFORMATION:  Ordering Provider Reason For Exam:  r/o fx, bleed (accession [id number]),  r/o fx  (accession [id number])  Technologist Note:  Additional:      EXAMINATION:    1. Computed tomography (CT) of the head without contrast  2. CT of the maxillofacial bones, orbits, and paranasal sinuses without  contrast  3. CT of the cervical spine without contrast     TECHNIQUE: CT of the head, cervical spine, and maxillofacial bones,   orbits,  and paranasal sinuses was performed without contrast according to standard  protocol.    COMPARISON: No prior study is available for comparison at the time of this  dictation.    FINDINGS:    Head:    No acute intracranial hemorrhage or intra- or extra-axial fluid   collections  are identified. The ventricles are of normal size, shape, and morphology.  The basal cisterns are patent. No mass effect or midline shift is seen.   The  gray-white matter differentiation is normal. There is vascular  calcification of the carotid siphons.     No acute calvarial fracture is identified.    Maxillofacial:    No soft tissue abnormality is identified.     The orbits including the globes, optic nerves, retrobulbar fat and  extraocular muscles appear normal. The paranasal sinuses are clear. The  hard palate, mandible, and temporomandibular joints appear normal. The  mastoid air cells are clear.     Cervical spine:    No soft tissue abnormality is identified.     The cervical spine is straightened with loss  of cervical lordosis. The  prevertebral soft tissue is normal in thickness. The mineralization of the  bones is normal. Vertebral bodies are normal in height without evidence of  acute fracture. Other than middle atlantoaxial joint osteoarthritis, the  craniocervical junction appears normal. 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 The central canal is patent. There  are varying degrees of mild facet osteoarthritis. There are varying   degrees  of mild to moderate multilevel uncovertebral joint osteoarthritis with  the  same degree of neural foraminal stenosis at these levels.      IMPRESSION:    1. No acute intracranial process.    2. No acute facial bone fractures identified.    3. No evidence of acute fracture in the cervical spine.    > Interpreting Provider: Pixie Call, MD on 05/24/2024 9:01 PM    XR WRIST RIGHT 3VW OR MORE    (Results Pending)  XR HAND RIGHT 3VW OR MORE    (Results Pending)  XR CHEST 2VW    (Results Pending)   CT HEAD WO CONTRAST Result Date: 05/24/2024 IMPRESSION: 1. No acute intracranial process. 2. No acute facial bone fractures identified. 3. No evidence of acute fracture in the cervical spine. > Interpreting Provider: Pixie Call, MD on 05/24/2024 9:01 PM  CT CERVICAL SPINE WO CONTRAST Result Date: 05/24/2024 IMPRESSION: 1. No acute intracranial process. 2. No acute facial bone fractures identified. 3. No evidence of acute fracture in the cervical spine. > Interpreting Provider: Pixie Call, MD on 05/24/2024 9:01 PM  CT Facial Bones Wo Contrast Result Date: 05/24/2024 IMPRESSION: 1. No acute intracranial process. 2. No acute facial bone fractures identified. 3. No evidence of acute fracture in the cervical spine. > Interpreting Provider: Pixie Call, MD on 05/24/2024 9:01 PM   - MEDS:  Medications  acetaminophen  (Tylenol ) tablet 1,000 mg (1,000 mg Oral $ Given 05/24/24 1957)    ED COURSE: All Labs/Imaging/ECG, other diagnostics independently interpreted by me.  ED Course as of 05/25/24 0457  Fri May 25, 2024  0149 I have reviewed her diagnostic findings and she has had an opportunity to ask me any questions she has about care, diagnosis and discharge plan. Patient is comfortable with the discharge plan. She will follow up as directed and will return to the ER if her condition worsens or she develops other urgent concerns. [VV]    ED Course User Index [VV] [name]      Clinical Impressions as of 05/25/24 0457  Fall, initial encounter  Right hand pain   Hypertension, unspecified type       Consult No  Procedure done at this time No  Ultrasound done at this time No  Medications given in ED: Yes - see MAR  cct   Conclusion and Disposition:   Acute problems: r wrist pain, fall  Exacerbations of chronic problems: n/a  Systemic issues: n/a   Consultations in the ED: n/a  Medication changes: n/a  Follow up: PCP     Orders Placed This Encounter  . XR WRIST RIGHT 3VW OR MORE  . XR HAND RIGHT 3VW OR MORE  . XR CHEST 2VW  . CT HEAD WO CONTRAST  . CT CERVICAL SPINE WO CONTRAST  . CT Facial Bones Wo Contrast  . acetaminophen  (Tylenol ) tablet 1,000 mg   Medications  acetaminophen  (Tylenol ) tablet 1,000 mg (1,000 mg Oral $ Given 05/24/24 1957)    Clinical Impression: 1. Fall, initial encounter   2. Right hand  pain   3. Hypertension, unspecified type     Disposition:  Discharged    By signing my name below, I, [name], attest that this documentation has been prepared under the direction and in the presence of Dr. Hunt. Signed: Varun Vadhera, Scribe.    I, Dr. Hunt, personally performed the services described in this documentation. All medical record entries made by the scribe were at my direction and in my presence. I have reviewed the chart and agree that the record reflects my personal performance and is accurate and complete. Electronically signed: Dr. Hunt.       [1] No past medical history on file. [2] No past surgical history on file.

## 2024-08-10 DIAGNOSIS — M1712 Unilateral primary osteoarthritis, left knee: Secondary | ICD-10-CM | POA: Diagnosis not present

## 2024-08-14 DIAGNOSIS — M25562 Pain in left knee: Secondary | ICD-10-CM | POA: Diagnosis not present

## 2024-08-14 DIAGNOSIS — M1712 Unilateral primary osteoarthritis, left knee: Secondary | ICD-10-CM | POA: Diagnosis not present

## 2024-08-14 DIAGNOSIS — M2392 Unspecified internal derangement of left knee: Secondary | ICD-10-CM | POA: Diagnosis not present

## 2024-08-15 ENCOUNTER — Other Ambulatory Visit: Payer: Self-pay | Admitting: Student

## 2024-08-15 DIAGNOSIS — M1712 Unilateral primary osteoarthritis, left knee: Secondary | ICD-10-CM

## 2024-08-15 DIAGNOSIS — M2392 Unspecified internal derangement of left knee: Secondary | ICD-10-CM

## 2024-08-18 ENCOUNTER — Ambulatory Visit
Admission: RE | Admit: 2024-08-18 | Discharge: 2024-08-18 | Disposition: A | Discharge status: Home / Self Care | Source: Ambulatory Visit | Attending: Student | Admitting: Student

## 2024-08-18 DIAGNOSIS — M2392 Unspecified internal derangement of left knee: Secondary | ICD-10-CM | POA: Insufficient documentation

## 2024-08-18 DIAGNOSIS — M25462 Effusion, left knee: Secondary | ICD-10-CM | POA: Diagnosis not present

## 2024-08-18 DIAGNOSIS — M2242 Chondromalacia patellae, left knee: Secondary | ICD-10-CM | POA: Diagnosis not present

## 2024-08-18 DIAGNOSIS — M1712 Unilateral primary osteoarthritis, left knee: Secondary | ICD-10-CM | POA: Diagnosis not present

## 2024-08-18 DIAGNOSIS — S83232A Complex tear of medial meniscus, current injury, left knee, initial encounter: Secondary | ICD-10-CM | POA: Diagnosis not present

## 2024-08-21 DIAGNOSIS — H5203 Hypermetropia, bilateral: Secondary | ICD-10-CM | POA: Diagnosis not present

## 2024-08-21 DIAGNOSIS — H524 Presbyopia: Secondary | ICD-10-CM | POA: Diagnosis not present

## 2024-08-21 DIAGNOSIS — H1712 Central corneal opacity, left eye: Secondary | ICD-10-CM | POA: Diagnosis not present

## 2024-08-21 DIAGNOSIS — H2513 Age-related nuclear cataract, bilateral: Secondary | ICD-10-CM | POA: Diagnosis not present

## 2024-10-03 DIAGNOSIS — D2261 Melanocytic nevi of right upper limb, including shoulder: Secondary | ICD-10-CM | POA: Diagnosis not present

## 2024-10-03 DIAGNOSIS — D2271 Melanocytic nevi of right lower limb, including hip: Secondary | ICD-10-CM | POA: Diagnosis not present

## 2024-10-03 DIAGNOSIS — D2262 Melanocytic nevi of left upper limb, including shoulder: Secondary | ICD-10-CM | POA: Diagnosis not present

## 2024-10-03 DIAGNOSIS — Z85828 Personal history of other malignant neoplasm of skin: Secondary | ICD-10-CM | POA: Diagnosis not present

## 2024-10-03 DIAGNOSIS — D225 Melanocytic nevi of trunk: Secondary | ICD-10-CM | POA: Diagnosis not present

## 2024-10-03 DIAGNOSIS — D0472 Carcinoma in situ of skin of left lower limb, including hip: Secondary | ICD-10-CM | POA: Diagnosis not present

## 2024-10-03 DIAGNOSIS — D485 Neoplasm of uncertain behavior of skin: Secondary | ICD-10-CM | POA: Diagnosis not present

## 2024-10-03 DIAGNOSIS — Z08 Encounter for follow-up examination after completed treatment for malignant neoplasm: Secondary | ICD-10-CM | POA: Diagnosis not present

## 2024-10-03 DIAGNOSIS — L820 Inflamed seborrheic keratosis: Secondary | ICD-10-CM | POA: Diagnosis not present

## 2024-10-03 DIAGNOSIS — L821 Other seborrheic keratosis: Secondary | ICD-10-CM | POA: Diagnosis not present

## 2024-10-03 DIAGNOSIS — D2272 Melanocytic nevi of left lower limb, including hip: Secondary | ICD-10-CM | POA: Diagnosis not present

## 2024-10-03 DIAGNOSIS — D0462 Carcinoma in situ of skin of left upper limb, including shoulder: Secondary | ICD-10-CM | POA: Diagnosis not present

## 2024-10-03 DIAGNOSIS — D0439 Carcinoma in situ of skin of other parts of face: Secondary | ICD-10-CM | POA: Diagnosis not present

## 2024-10-03 DIAGNOSIS — C44519 Basal cell carcinoma of skin of other part of trunk: Secondary | ICD-10-CM | POA: Diagnosis not present

## 2024-10-04 ENCOUNTER — Other Ambulatory Visit: Payer: Self-pay | Admitting: Internal Medicine

## 2024-10-04 DIAGNOSIS — M2392 Unspecified internal derangement of left knee: Secondary | ICD-10-CM | POA: Diagnosis not present

## 2024-10-04 DIAGNOSIS — Z1231 Encounter for screening mammogram for malignant neoplasm of breast: Secondary | ICD-10-CM

## 2024-10-08 DIAGNOSIS — D0462 Carcinoma in situ of skin of left upper limb, including shoulder: Secondary | ICD-10-CM | POA: Diagnosis not present

## 2024-10-08 DIAGNOSIS — D049 Carcinoma in situ of skin, unspecified: Secondary | ICD-10-CM | POA: Diagnosis not present

## 2024-10-08 DIAGNOSIS — D0472 Carcinoma in situ of skin of left lower limb, including hip: Secondary | ICD-10-CM | POA: Diagnosis not present

## 2024-10-08 DIAGNOSIS — C44519 Basal cell carcinoma of skin of other part of trunk: Secondary | ICD-10-CM | POA: Diagnosis not present

## 2024-10-22 ENCOUNTER — Encounter
Admission: RE | Admit: 2024-10-22 | Discharge: 2024-10-22 | Disposition: A | Discharge status: Home / Self Care | Source: Ambulatory Visit | Attending: Orthopedic Surgery | Admitting: Orthopedic Surgery

## 2024-10-22 ENCOUNTER — Encounter: Payer: Self-pay | Admitting: Orthopedic Surgery

## 2024-10-22 ENCOUNTER — Encounter: Payer: Self-pay | Admitting: Urgent Care

## 2024-10-22 DIAGNOSIS — E119 Type 2 diabetes mellitus without complications: Secondary | ICD-10-CM | POA: Diagnosis not present

## 2024-10-22 DIAGNOSIS — Z01818 Encounter for other preprocedural examination: Secondary | ICD-10-CM | POA: Insufficient documentation

## 2024-10-22 DIAGNOSIS — Z01812 Encounter for preprocedural laboratory examination: Secondary | ICD-10-CM | POA: Diagnosis present

## 2024-10-22 DIAGNOSIS — I10 Essential (primary) hypertension: Secondary | ICD-10-CM | POA: Insufficient documentation

## 2024-10-22 DIAGNOSIS — E039 Hypothyroidism, unspecified: Secondary | ICD-10-CM | POA: Insufficient documentation

## 2024-10-22 DIAGNOSIS — E785 Hyperlipidemia, unspecified: Secondary | ICD-10-CM | POA: Diagnosis not present

## 2024-10-22 DIAGNOSIS — Z01810 Encounter for preprocedural cardiovascular examination: Secondary | ICD-10-CM | POA: Diagnosis present

## 2024-10-22 LAB — BASIC METABOLIC PANEL WITH GFR
Anion gap: 11 (ref 5–15)
BUN: 20 mg/dL (ref 8–23)
CO2: 25 mmol/L (ref 22–32)
Calcium: 9.7 mg/dL (ref 8.9–10.3)
Chloride: 101 mmol/L (ref 98–111)
Creatinine, Ser: 0.76 mg/dL (ref 0.44–1.00)
GFR, Estimated: >60 mL/min (ref >60)
Glucose, Bld: 80 mg/dL (ref 70–99)
Potassium: 4 mmol/L (ref 3.5–5.1)
Sodium: 137 mmol/L (ref 135–145)

## 2024-10-22 LAB — CBC
HCT: 42.8 % (ref 36.0–46.0)
Hemoglobin: 14 g/dL (ref 12.0–15.0)
MCH: 29.8 pg (ref 26.0–34.0)
MCHC: 32.7 g/dL (ref 30.0–36.0)
MCV: 91.1 fL (ref 80.0–100.0)
Platelets: 240 K/uL (ref 150–400)
RBC: 4.7 MIL/uL (ref 3.87–5.11)
RDW: 13 % (ref 11.5–15.5)
WBC: 4.8 K/uL (ref 4.0–10.5)
nRBC: 0 % (ref 0.0–0.2)

## 2024-10-22 NOTE — H&P (Signed)
 ORTHOPAEDIC HISTORY & PHYSICAL Rekita Miotke, Lynwood Idalia Raddle., MD - 10/04/2024 2:45 PM EST Formatting of this note is different from the original. Images from the original note were not included. Chief Complaint: Chief Complaint Patient presents with Left Knee - Pain Discuss sx  Reason for Visit: The patient is a 68 y.o. female who presents today for reevaluation of her left knee. She reported the acute onset of left knee pain in early September. She was arising from a seated position and felt a pop with the immediate onset of pain. She initially had difficulty with weight bearing due to the pain. She was treated at [facility] acute care with Celebrex  and an intraarticular corticosteroid injection. The severe pain has markedly improved with in-line activities but is still aggravated by pivoting or twisting. She localizes most of the pain along the medial aspect of the knee. She reports some swelling, no locking, and some giving way of the knee. She is not using any ambulatory aids.  Medications: Current Outpatient Medications Medication Sig Dispense Refill celecoxib  (CELEBREX ) 200 MG capsule TAKE 1 CAPSULE(200 MG) BY MOUTH DAILY (Patient taking differently: Take 200 mg by mouth once daily as needed for Pain) 90 capsule 1 amoxicillin (AMOXIL) 500 MG capsule Take 4 capsules by mouth 1 HOUR BEFORE DENTAL APPOINTMENT omeprazole (PRILOSEC) 20 MG DR capsule TAKE 1 CAPSULE(20 MG) BY MOUTH DAILY 90 capsule 1  No current facility-administered medications for this visit.  Allergies: Allergies Allergen Reactions Meloxicam  Other (See Comments) Hematuria with large doses  Past Medical History: Past Medical History: Diagnosis Date Anemia Anxiety Arthritis Few years Chicken pox COPD (chronic obstructive pulmonary disease) (CMS/HHS-HCC) Depression Diabetes mellitus without complication (CMS/HHS-HCC) GERD (gastroesophageal reflux disease) High cholesterol History of cancer Hypertension Kidney  infection Kidney stones Migraines Reflux esophagitis Seizures (CMS/HHS-HCC) Thyroid disease  Past Surgical History: Past Surgical History: Procedure Laterality Date Right knee arthroscopy, partial lateral meniscectomy, and chondroplasty 10/08/2019 Dr Mardee Right total knee arthroplasty using computer-assisted navigation 07/03/2021 Dr Mardee Colon @ [facility] 10/24/2023 Tubular adenoma/PHx CP/Repeat 60yrs/TKT EGD @ [facility] 10/24/2023 Barrett's Esophagus/Fundic gland polyps/Repeat 25yrs/TKT APPENDECTOMY CESAREAN SECTION 1986 Eptopic pregnancy eptopic preg HYSTERECTOMY VAGINAL KNEE ARTHROSCOPY 2021 torn meniscus TONSILLECTOMY  Social History: Social History  Socioeconomic History Marital status: Widowed Number of children: 1 Years of education: 12 Highest education level: High school graduate Occupational History Occupation: Retired- Part-time Seasonal tax work, Landscape architect Tobacco Use Smoking status: Never Smokeless tobacco: Never Tobacco comments: No history with tobacco Vaping Use Vaping status: Never Used Substance and Sexual Activity Alcohol use: Never Drug use: Never Sexual activity: Not Currently Partners: Male Birth control/protection: Post-menopausal  Social Drivers of Health  Financial Resource Strain: Low Risk (10/04/2024) Overall Financial Resource Strain (CARDIA) Difficulty of Paying Living Expenses: Not hard at all Food Insecurity: No Food Insecurity (10/04/2024) Hunger Vital Sign Worried About Running Out of Food in the Last Year: Never true Ran Out of Food in the Last Year: Never true Transportation Needs: No Transportation Needs (10/04/2024) PRAPARE - Contractor (Medical): No Lack of Transportation (Non-Medical): No Housing Stability: Low Risk (10/04/2024) Housing Stability Vital Sign Unable to Pay for Housing in the Last Year: No Number of Times Moved in the Last Year: 0 Homeless in the Last Year: No  Family  History: Family History Problem Relation Name Age of Onset Stroke Mother Carole now deceased High blood pressure (Hypertension) Mother Carole now deceased Diabetes type II Mother Carole now deceased Obesity Mother Carole Melanoma Father Gaither Diabetes type II Father Gaither now  deceased High blood pressure (Hypertension) Father Gaither now deceased Hyperlipidemia (Elevated cholesterol) Father Gaither now deceased Obesity Father Gaither Breast cancer Sister Avelina Survivor No Known Problems Sister  Review of Systems: A comprehensive 14 point ROS was performed, reviewed, and the pertinent orthopaedic findings are documented in the HPI.  Exam BP 126/74 (BP Location: Left upper arm, Patient Position: Sitting, BP Cuff Size: Adult)  Ht 165.1 cm (5' 5)  Wt 92.6 kg (204 lb 3.2 oz)  BMI 33.98 kg/m  General: Well-developed, well-nourished female seen in no acute distress. Even heel to toe gait. No varus or valgus thrust to the left knee.  HEENT: Atraumatic, normocephalic. Pupils are equal and reactive to light. Extraocular motion is intact. Sclera are clear. Oropharynx is clear with moist mucosa.  Lungs: Clear to auscultation bilaterally.  Cardiovascular: Regular rate and rhythm. Normal S1, S2. No murmur . No appreciable gallops or rubs. Peripheral pulses are palpable. No lower extremity edema. Homan`s test is negative.  Extremities: Good strength, stability, and range of motion of the upper extremities. Good range of motion of the hips and ankles.  Left Knee: Soft tissue swelling: minimal Effusion: none Erythema: none Crepitance: none Tenderness: medial Alignment: normal Mediolateral laxity: stable Anterior drawer test:negative Lachman`s test: negative McMurray`s test: positive Atrophy: No significant atrophy. Quadriceps tone was fair to good. Range of Motion: greater than 120 degrees  Neurologic: Awake, alert, and oriented. Sensory function is intact to pinprick and light  touch. Motor strength is judged to be 5/5. Motor coordination is within normal limits. No apparent clonus. No tremor.  Radiographs: I reviewed the left knee radiographs that were performed at [facility] on 08/14/2024. There is relatively good preservation of the cartilage space. No chondrocalcinosis is appreciated. No significant subchondral sclerosis. Early osteophyte formation is present. No evidence of fracture or dislocation.  MRI: I reviewed the left knee MRI from [facility] dated 08/18/2024. I concur with the radiologist's interpretation as below:  MR KNEE WITHOUT IV CONTRAST LEFT  COMPARISON: None.  CLINICAL HISTORY: Knee pain.  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 No focal full-thickness cartilage defect is appreciated. There is moderate subchondral reactive edema in the patella. Small reactive joint effusion. Extensor mechanism is intact.  Ligaments: Mild increased signal in the cruciate ligaments consistent with mucoid degeneration. No cruciate ligament injury is appreciated. The MCL and fibular collateral ligaments are intact.  Menisci: There is a complex tear of the posterior horn of the medial meniscus with loss of the posterior horn. There is slight medial displacement of body. No displaced medial meniscal flap is appreciated. Complex tear of the posterior horn, body and anterior horn lateral meniscus with a large horizontal component. There is no full-thickness.  IMPRESSION: Tricompartmental osteoarthrosis with moderate chondromalacia. No full-thickness cartilage defect or acute fracture. There is moderate subchondral reactive edema in the patella. Small reactive joint effusion and  Complex tear of the posterior horn and body of the  medial meniscus with medial displacement of the body. Large horizontal tear of the anterior horn, body and posterior horn of the lateral meniscus without a displaced meniscal flap.  Electronically signed by: Norleen Satchel MD 08/20/2024 09:06 AM EDT RP Workstation: [id number]  Impression: Internal derangement of the left knee  Plan: The findings were discussed in detail with the patient. The patient was given  informational material on knee arthroscopy. Conservative treatment options were reviewed with the patient. We discussed the risks and benefits of surgical intervention. Arthroscopy is an appropriate treatment for the meniscal pathology, but would have limited or no effect on degenerative changes of the articular cartilage. The usual perioperative course was also discussed in detail. The patient expressed understanding of the risks and benefits of surgical intervention and would like to proceed with plans for left knee arthroscopy.  I spent a total of 45 minutes in both face-to-face and non-face-to-face activities, excluding procedures performed, for this visit on the date of this encounter.  MEDICAL CLEARANCE: Per anesthesiology. ACTIVITIES: Avoid pivoting, squatting, or twisting. WORK STATUS: Not applicable. THERAPY: Quadriceps strengthening exercises. MEDICATIONS: Requested Prescriptions  No prescriptions requested or ordered in this encounter  FOLLOW-UP: Return for postoperative follow-up.  Claxton Levitz P. Zandrea Kenealy, Jr., M.D.  This note was generated in part with voice recognition software and I apologize for any typographical errors that were not detected and corrected. Electronically signed by Mardee Lynwood Idalia Mickey., MD at 10/07/2024 11:57 AM EST

## 2024-10-22 NOTE — Discharge Instructions (Addendum)
  Instructions after Knee Arthroscopy    Akeel Reffner P. Erina Hamme, Jr., M.D.    Dept. of Orthopaedics & Sports Medicine [facility] [address] [place], [place]  [address]  Phone: [phone]   Fax: [phone]       [email or web address]       DIET: Drink plenty of non-alcoholic fluids & begin a light diet. Resume your normal diet the day after surgery.  ACTIVITY:  You may use crutches or a walker with weight-bearing as tolerated, unless instructed otherwise. You may wean yourself off of the walker or crutches as tolerated.  Begin doing gentle exercises. Exercising will reduce the pain and swelling, increase motion, and prevent muscle weakness.   Avoid strenuous activities or athletics for a minimum of 4-6 weeks after arthroscopic surgery. Do not drive or operate any equipment until instructed.  WOUND CARE:  Place one to two pillows under the knee the first day or two when sitting or lying.  Continue to use the ice packs periodically to reduce pain and swelling. The small incisions in your knee are closed with nylon stitches. The stitches will be removed in the office. The bulky dressing may be removed on the second day after surgery. DO NOT TOUCH THE STITCHES. Put a Band-Aid over each stitch. Do NOT use any ointments or creams on the incisions.  You may bathe or shower after the stitches are removed at the first office visit following surgery.  MEDICATIONS: You may resume your regular medications. Please take the pain medication as prescribed. Do not take pain medication on an empty stomach. Do not drive or drink alcoholic beverages when taking pain medications.  CALL THE OFFICE FOR: Temperature above 101 degrees Excessive bleeding or drainage on the dressing. Excessive swelling, coldness, or paleness of the toes. Persistent nausea and vomiting.  FOLLOW-UP:  You should have an appointment to return to the office in 7-10 days after surgery.       [facility] Department Directory         [email or web address]       [email or web address]          Cardiology  Appointments: [place] - [phone]  Endocrinology  Appointments: [place] [phone] [place] - [phone]  Gastroenterology  Appointments: [place] [phone] [place] - [phone]        General Surgery   Appointments: [facility]  Internal Medicine/Family Medicine  Appointments: [facility] [place] - [phone] [place] - [phone]  Metabolic and Weigh Loss Surgery  Appointments: [facility]        Neurology  Appointments: [place] [phone] [place] - [phone]  Neurosurgery  Appointments: [place]  Obstetrics & Gynecology  Appointments: [place] [phone] [place] - [phone]        Pediatrics  Appointments: [place] [phone] [place] - [phone]  Physiatry  Appointments: [place] [phone]  Physical Therapy  Appointments: [place] - [phone]        Podiatry  Appointments: [place] [phone] [place] - [phone]  Pulmonology  Appointments: [place]  Rheumatology  Appointments: [place] [phone]        [place] Location: [facility]  [address] [place], [place]  [address]  [place] Location: [facility] [address] [place], [place]  [address]  [place] Location: [facility] [address] [place], [place]  [address]

## 2024-10-23 LAB — HEMOGLOBIN A1C
Hgb A1c MFr Bld: 6 % — ABNORMAL HIGH (ref 4.8–5.6)
Mean Plasma Glucose: 126 mg/dL

## 2024-10-28 ENCOUNTER — Encounter: Payer: Self-pay | Admitting: Orthopedic Surgery

## 2024-10-29 ENCOUNTER — Ambulatory Visit: Payer: Self-pay | Admitting: Urgent Care

## 2024-10-29 ENCOUNTER — Encounter: Payer: Self-pay | Admitting: Orthopedic Surgery

## 2024-10-29 ENCOUNTER — Ambulatory Visit
Admission: RE | Admit: 2024-10-29 | Discharge: 2024-10-29 | Disposition: A | Source: Ambulatory Visit | Attending: Orthopedic Surgery | Admitting: Orthopedic Surgery

## 2024-10-29 ENCOUNTER — Other Ambulatory Visit: Payer: Self-pay

## 2024-10-29 ENCOUNTER — Encounter: Admission: RE | Disposition: A | Payer: Self-pay | Source: Ambulatory Visit | Attending: Orthopedic Surgery

## 2024-10-29 DIAGNOSIS — M2392 Unspecified internal derangement of left knee: Secondary | ICD-10-CM | POA: Diagnosis not present

## 2024-10-29 DIAGNOSIS — Z01810 Encounter for preprocedural cardiovascular examination: Secondary | ICD-10-CM

## 2024-10-29 DIAGNOSIS — E119 Type 2 diabetes mellitus without complications: Secondary | ICD-10-CM

## 2024-10-29 DIAGNOSIS — E785 Hyperlipidemia, unspecified: Secondary | ICD-10-CM | POA: Diagnosis not present

## 2024-10-29 DIAGNOSIS — I10 Essential (primary) hypertension: Secondary | ICD-10-CM

## 2024-10-29 DIAGNOSIS — E039 Hypothyroidism, unspecified: Secondary | ICD-10-CM | POA: Diagnosis not present

## 2024-10-29 DIAGNOSIS — Z9889 Other specified postprocedural states: Secondary | ICD-10-CM

## 2024-10-29 DIAGNOSIS — Z01812 Encounter for preprocedural laboratory examination: Secondary | ICD-10-CM

## 2024-10-29 HISTORY — DX: Unspecified convulsions: R56.9

## 2024-10-29 HISTORY — DX: Anemia, unspecified: D64.9

## 2024-10-29 HISTORY — DX: Essential (primary) hypertension: I10

## 2024-10-29 HISTORY — DX: Calculus of kidney: N20.0

## 2024-10-29 HISTORY — DX: Hypothyroidism, unspecified: E03.9

## 2024-10-29 HISTORY — DX: Type 2 diabetes mellitus without complications: E11.9

## 2024-10-29 HISTORY — PX: KNEE ARTHROSCOPY: SHX127

## 2024-10-29 HISTORY — DX: Anxiety disorder, unspecified: F41.9

## 2024-10-29 HISTORY — DX: Hyperlipidemia, unspecified: E78.5

## 2024-10-29 HISTORY — PX: MENISCUS DEBRIDEMENT: SHX5178

## 2024-10-29 SURGERY — DEBRIDEMENT, MENISCUS, KNEE
Anesthesia: General | Site: Knee | Laterality: Left

## 2024-10-29 MED ORDER — LIDOCAINE HCL (CARDIAC) PF 100 MG/5ML IV SOSY
PREFILLED_SYRINGE | INTRAVENOUS | Status: DC | PRN
  Administered 2024-10-29: 100 mg via INTRAVENOUS

## 2024-10-29 MED ORDER — ACETAMINOPHEN 10 MG/ML IV SOLN
INTRAVENOUS | Status: DC | PRN
  Administered 2024-10-29: 1000 mg via INTRAVENOUS

## 2024-10-29 MED ORDER — ONDANSETRON HCL 4 MG PO TABS: 4.0000 mg | ORAL_TABLET | Freq: Four times a day (QID) | ORAL | Status: DC | PRN

## 2024-10-29 MED ORDER — CELECOXIB 200 MG PO CAPS
400.0000 mg | ORAL_CAPSULE | Freq: Once | ORAL | Status: AC
  Administered 2024-10-29: 400 mg via ORAL

## 2024-10-29 MED ORDER — METOCLOPRAMIDE HCL 5 MG/ML IJ SOLN: 5.0000 mg | Freq: Three times a day (TID) | INTRAMUSCULAR | Status: DC | PRN

## 2024-10-29 MED ORDER — FENTANYL CITRATE (PF) 100 MCG/2ML IJ SOLN
INTRAMUSCULAR | Status: AC
  Filled 2024-10-29: qty 2

## 2024-10-29 MED ORDER — LIDOCAINE HCL (PF) 2 % IJ SOLN
INTRAMUSCULAR | Status: AC
  Filled 2024-10-29: qty 5

## 2024-10-29 MED ORDER — BUPIVACAINE-EPINEPHRINE 0.25% -1:200000 IJ SOLN
INTRAMUSCULAR | Status: DC | PRN
  Administered 2024-10-29: 5 mL

## 2024-10-29 MED ORDER — ONDANSETRON HCL 4 MG/2ML IJ SOLN: 4.0000 mg | Freq: Four times a day (QID) | INTRAMUSCULAR | Status: DC | PRN

## 2024-10-29 MED ORDER — ACETAMINOPHEN 325 MG PO TABS: 325.0000 mg | ORAL_TABLET | Freq: Four times a day (QID) | ORAL | Status: DC | PRN

## 2024-10-29 MED ORDER — HYDROCODONE-ACETAMINOPHEN 5-325 MG PO TABS: 1.0000 | ORAL_TABLET | ORAL | Status: DC | PRN

## 2024-10-29 MED ORDER — CHLORHEXIDINE GLUCONATE 0.12 % MT SOLN
15.0000 mL | Freq: Once | OROMUCOSAL | Status: AC
  Administered 2024-10-29: 15 mL via OROMUCOSAL

## 2024-10-29 MED ORDER — CHLORHEXIDINE GLUCONATE 0.12 % MT SOLN
OROMUCOSAL | Status: AC
  Filled 2024-10-29: qty 15

## 2024-10-29 MED ORDER — PHENYLEPHRINE 80 MCG/ML (10ML) SYRINGE FOR IV PUSH (FOR BLOOD PRESSURE SUPPORT)
PREFILLED_SYRINGE | INTRAVENOUS | Status: DC | PRN
  Administered 2024-10-29: 80 ug via INTRAVENOUS

## 2024-10-29 MED ORDER — MIDAZOLAM HCL 2 MG/2ML IJ SOLN
INTRAMUSCULAR | Status: AC
  Filled 2024-10-29: qty 2

## 2024-10-29 MED ORDER — OXYCODONE HCL 5 MG/5ML PO SOLN: 5.0000 mg | Freq: Once | ORAL | Status: AC | PRN

## 2024-10-29 MED ORDER — ACETAMINOPHEN 10 MG/ML IV SOLN: 1000.0000 mg | Freq: Once | INTRAVENOUS | Status: DC | PRN

## 2024-10-29 MED ORDER — MORPHINE SULFATE (PF) 4 MG/ML IV SOLN
INTRAVENOUS | Status: AC
  Filled 2024-10-29: qty 1

## 2024-10-29 MED ORDER — MORPHINE SULFATE (PF) 4 MG/ML IV SOLN
INTRAVENOUS | Status: DC | PRN
  Administered 2024-10-29: 26 mL

## 2024-10-29 MED ORDER — RINGERS IRRIGATION IR SOLN
Status: DC | PRN
  Administered 2024-10-29 (×4): 3000 mL

## 2024-10-29 MED ORDER — FENTANYL CITRATE (PF) 100 MCG/2ML IJ SOLN
INTRAMUSCULAR | Status: DC | PRN
  Administered 2024-10-29: 50 ug via INTRAVENOUS
  Administered 2024-10-29 (×2): 25 ug via INTRAVENOUS

## 2024-10-29 MED ORDER — OXYCODONE HCL 5 MG PO TABS
5.0000 mg | ORAL_TABLET | Freq: Once | ORAL | Status: AC | PRN
  Administered 2024-10-29: 5 mg via ORAL

## 2024-10-29 MED ORDER — PROPOFOL 10 MG/ML IV BOLUS
INTRAVENOUS | Status: AC
  Filled 2024-10-29: qty 20

## 2024-10-29 MED ORDER — SODIUM CHLORIDE 0.9 % IV SOLN: INTRAVENOUS | Status: DC

## 2024-10-29 MED ORDER — METOCLOPRAMIDE HCL 10 MG PO TABS: 5.0000 mg | ORAL_TABLET | Freq: Three times a day (TID) | ORAL | Status: DC | PRN

## 2024-10-29 MED ORDER — CEFAZOLIN SODIUM-DEXTROSE 2-3 GM-%(50ML) IV SOLR
INTRAVENOUS | Status: DC | PRN
  Administered 2024-10-29: 2 g via INTRAVENOUS

## 2024-10-29 MED ORDER — ACETAMINOPHEN 10 MG/ML IV SOLN
INTRAVENOUS | Status: AC
  Filled 2024-10-29: qty 100

## 2024-10-29 MED ORDER — DROPERIDOL 2.5 MG/ML IJ SOLN: 0.6250 mg | Freq: Once | INTRAMUSCULAR | Status: DC | PRN

## 2024-10-29 MED ORDER — FENTANYL CITRATE (PF) 100 MCG/2ML IJ SOLN: 25.0000 ug | INTRAMUSCULAR | Status: DC | PRN

## 2024-10-29 MED ORDER — HYDROCODONE-ACETAMINOPHEN 5-325 MG PO TABS: 1.0000 | ORAL_TABLET | ORAL | 0 refills | Status: AC | PRN

## 2024-10-29 MED ORDER — OXYCODONE HCL 5 MG PO TABS
ORAL_TABLET | ORAL | Status: AC
  Filled 2024-10-29: qty 1

## 2024-10-29 MED ORDER — HYDROCODONE-ACETAMINOPHEN 7.5-325 MG PO TABS
1.0000 | ORAL_TABLET | ORAL | Status: DC | PRN
  Filled 2024-10-29: qty 2

## 2024-10-29 MED ORDER — PROPOFOL 10 MG/ML IV BOLUS
INTRAVENOUS | Status: DC | PRN
  Administered 2024-10-29: 100 mg via INTRAVENOUS
  Administered 2024-10-29: 50 mg via INTRAVENOUS

## 2024-10-29 MED ORDER — BUPIVACAINE-EPINEPHRINE (PF) 0.25% -1:200000 IJ SOLN
INTRAMUSCULAR | Status: AC
  Filled 2024-10-29: qty 30

## 2024-10-29 MED ORDER — PHENYLEPHRINE 80 MCG/ML (10ML) SYRINGE FOR IV PUSH (FOR BLOOD PRESSURE SUPPORT)
PREFILLED_SYRINGE | INTRAVENOUS | Status: AC
  Filled 2024-10-29: qty 10

## 2024-10-29 MED ORDER — CELECOXIB 200 MG PO CAPS
ORAL_CAPSULE | ORAL | Status: AC
  Filled 2024-10-29: qty 2

## 2024-10-29 MED ORDER — DEXAMETHASONE SOD PHOSPHATE PF 10 MG/ML IJ SOLN
INTRAMUSCULAR | Status: DC | PRN
  Administered 2024-10-29: 10 mg via INTRAVENOUS

## 2024-10-29 MED ORDER — MORPHINE SULFATE (PF) 2 MG/ML IV SOLN: 0.5000 mg | INTRAVENOUS | Status: DC | PRN

## 2024-10-29 MED ORDER — ONDANSETRON HCL 4 MG/2ML IJ SOLN
INTRAMUSCULAR | Status: DC | PRN
  Administered 2024-10-29: 4 mg via INTRAVENOUS

## 2024-10-29 MED ORDER — MIDAZOLAM HCL (PF) 2 MG/2ML IJ SOLN
INTRAMUSCULAR | Status: DC | PRN
  Administered 2024-10-29: 2 mg via INTRAVENOUS

## 2024-10-29 MED ORDER — ORAL CARE MOUTH RINSE: 15.0000 mL | Freq: Once | OROMUCOSAL | Status: AC

## 2024-10-29 SURGICAL SUPPLY — 25 items
ADAPTER IRRIG TUBE 2 SPIKE SOL (ADAPTER) ×4 IMPLANT
CUFF TRNQT CYL 24X4X16.5-23 (TOURNIQUET CUFF) IMPLANT
CUFF TRNQT CYL 30X4X21-28X (TOURNIQUET CUFF) IMPLANT
DRAPE ARTHROSCOPY W/POUCH 90 (DRAPES) ×2 IMPLANT
DRSG TEGADERM 2-3/8X2-3/4 SM (GAUZE/BANDAGES/DRESSINGS) IMPLANT
DURAPREP 26ML APPLICATOR (WOUND CARE) ×2 IMPLANT
GAUZE SPONGE 4X4 12PLY STRL (GAUZE/BANDAGES/DRESSINGS) ×2 IMPLANT
GAUZE XEROFORM 1X8 LF (GAUZE/BANDAGES/DRESSINGS) ×2 IMPLANT
GLOVE BIO SURGEON STRL SZ7.5 (GLOVE) ×2 IMPLANT
GOWN STRL REUS W/ TWL LRG LVL3 (GOWN DISPOSABLE) ×4 IMPLANT
IV LR IRRIG 3000ML ARTHROMATIC (IV SOLUTION) ×4 IMPLANT
KIT TURNOVER KIT A (KITS) ×2 IMPLANT
MANIFOLD NEPTUNE II (INSTRUMENTS) ×2 IMPLANT
PACK ARTHROSCOPY KNEE (MISCELLANEOUS) ×2 IMPLANT
PAD ARMBOARD POSITIONER FOAM (MISCELLANEOUS) ×2 IMPLANT
SHAVER BLADE TAPERED BLUNT 4 (BLADE) IMPLANT
SOLN STERILE WATER 500 ML (IV SOLUTION) ×2 IMPLANT
STOCKINETTE BIAS CUT 6 980064 (GAUZE/BANDAGES/DRESSINGS) ×2 IMPLANT
STOCKINETTE STRL BIAS CUT 8X4 (MISCELLANEOUS) IMPLANT
SUTURE EHLN 3-0 FS-10 30 BLK (SUTURE) ×2 IMPLANT
TRAP FLUID SMOKE EVACUATOR (MISCELLANEOUS) ×2 IMPLANT
TUBE SET DOUBLEFLO INFLOW (TUBING) ×2 IMPLANT
TUBE SET DOUBLEFLO OUTFLOW (TUBING) ×2 IMPLANT
WAND HAND CNTRL MULTIVAC 50 (MISCELLANEOUS) ×2 IMPLANT
WRAP KNEE W/COLD PACKS 25.5X14 (SOFTGOODS) ×2 IMPLANT

## 2024-10-29 NOTE — Op Note (Signed)
 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  SURGEON:  Lynwood SHAUNNA Mardee Mickey., M.D.   ASSISTANT: none  ANESTHESIA: general  ESTIMATED BLOOD LOSS: Minimal  FLUIDS REPLACED: 800 mL of crystalloid  TOURNIQUET TIME: Not used  INDICATIONS FOR SURGERY: [name] is a 68 y.o. year old female who has been seen for complaints of left knee pain. MRI demonstrated findings consistent with meniscal pathology. After discussion of the risks and benefits of surgical intervention, the patient expressed understanding of the risks benefits and agree with plans for left knee arthroscopy.   PROCEDURE IN DETAIL: The patient was brought into the operating room and, after adequate general anesthesia was achieved, a tourniquet was applied to the left thigh and the leg was placed in the leg holder. All bony prominences were well padded. The patient's left knee was cleaned and prepped with alcohol and Duraprep and draped in the usual sterile fashion. A timeout was performed as per usual protocol. The anticipated portal sites were injected with 0.25% Marcaine  with epinephrine . An anterolateral incision was made and a cannula was inserted. A small effusion was evacuated and the knee was distended with fluid using the pump. The scope was advanced down the medial gutter into the medial compartment. Under visualization with the scope, an anteromedial portal was created and a hooked probe was inserted. The medial meniscus  was visualized and probed.  There was a complex tear of the posterior horn of the medial meniscus.  The tear was debrided using meniscal punches and an incisor shaver.  Final contouring was performed using the 50 degree ArthroCare wand.  The remaining rim of meniscus was visualized and probed and felt to be stable.  The articular cartilage was visualized.  There were grade 3 changes of chondromalacia to the medial femoral condyle.  These areas were debrided and contoured using the ArthroCare wand.  The scope was then advanced into the intercondylar notch. The anterior cruciate ligament was visualized and probed and felt to be intact. The scope was removed from the lateral portal and reinserted via the anteromedial portal to better visualize the lateral compartment. The lateral meniscus was visualized and probed.  Complex tears were noted to the anterior and posterior horns of the lateral meniscus.  The tears were debrided using meniscal punches and the incisor shaver.  Final contouring was performed using the 50 degree ArthroCare wand.  Remaining rim meniscus was visualized and probed and felt to be stable.  The articular cartilage of the lateral compartment was visualized and felt to be in good condition.  Finally, the scope was advanced so as to visualize the patellofemoral articulation. Good patellar tracking was appreciated.  There were grade 3 changes of chondromalacia involving the trochlear groove and the articular surface of the patella.  These areas were debrided using the ArthroCare wand.  The knee was irrigated with copius amounts of fluid and suctioned dry. The anterolateral portal was re-approximated with #3-0 nylon. A combination of 0.25% Marcaine  with epinephrine  and 4 mg of Morphine  were  injected via the scope. The scope was removed and the anteromedial portal was re-approximated with #3-0 nylon. A sterile dressing was applied followed by application of an ice wrap.  The patient tolerated the  procedure well and was transported to the PACU in stable condition.  Marlisa Caridi P. Byron Tipping, Jr., M.D.

## 2024-10-29 NOTE — Anesthesia Procedure Notes (Signed)
 Procedure Name: LMA Insertion Date/Time: 10/29/2024 4:36 PM  Performed by: Jackye Spanner, CRNAPre-anesthesia Checklist: Patient identified, Patient being monitored, Timeout performed, Emergency Drugs available and Suction available Patient Re-evaluated:Patient Re-evaluated prior to induction Oxygen Delivery Method: Circle system utilized Preoxygenation: Pre-oxygenation with 100% oxygen Induction Type: IV induction Ventilation: Mask ventilation without difficulty LMA: LMA inserted LMA Size: 4.0 Tube type: Oral Number of attempts: 1 Placement Confirmation: positive ETCO2 and breath sounds checked- equal and bilateral Tube secured with: Tape Dental Injury: Teeth and Oropharynx as per pre-operative assessment  Comments: Smooth atraumatic LMA placement, no complications noted.

## 2024-10-29 NOTE — Anesthesia Preprocedure Evaluation (Signed)
 Anesthesia Evaluation  Patient identified by MRN, date of birth, ID band Patient awake    Reviewed: Allergy & Precautions, H&P , NPO status , Patient's Chart, lab work & pertinent test results  Airway Mallampati: II  TM Distance: >3 FB Neck ROM: full    Dental no notable dental hx.    Pulmonary neg pulmonary ROS   Pulmonary exam normal        Cardiovascular Normal cardiovascular exam     Neuro/Psych negative neurological ROS  negative psych ROS   GI/Hepatic Neg liver ROS,GERD  Medicated and Controlled,,  Endo/Other  Pre-diabetes  Renal/GU      Musculoskeletal   Abdominal  (+) + obese  Peds  Hematology negative hematology ROS (+)   Anesthesia Other Findings Past Medical History: 12/07/2018: Actinic keratoses     Comment:  Right upper arm. Right medial calf. No date: Anemia No date: Anxiety No date: Arthritis No date: GERD (gastroesophageal reflux disease) No date: Headache No date: HLD (hyperlipidemia) No date: HTN (hypertension) No date: Hypothyroidism No date: Nephrolithiasis No date: Seizures (HCC) No date: T2DM (type 2 diabetes mellitus) (HCC)  Past Surgical History: 2007: ABDOMINAL HYSTERECTOMY     Comment:  Partial, has both ovaries; for uterine prolapse No date: APPENDECTOMY 07/03/2021: KNEE ARTHROPLASTY; Right     Comment:  Procedure: COMPUTER ASSISTED TOTAL KNEE ARTHROPLASTY -               RNFA;  Surgeon: Mardee Lynwood SQUIBB, MD;  Location: [facility] ORS;              Service: Orthopedics;  Laterality: Right; 10/08/2019: KNEE ARTHROSCOPY; Right     Comment:  Procedure: ARTHROSCOPY KNEE;  Surgeon: Mardee Lynwood SQUIBB,               MD;  Location: [facility] ORS;  Service: Orthopedics;                Laterality: Right; 1988: LAPAROSCOPY FOR ECTOPIC PREGNANCY  BMI    Body Mass Index: 32.28 kg/m      Reproductive/Obstetrics negative OB ROS                              Anesthesia  Physical Anesthesia Plan  ASA: 2  Anesthesia Plan: General LMA   Post-op Pain Management: Ofirmev  IV (intra-op)* and Toradol IV (intra-op)*   Induction: Intravenous  PONV Risk Score and Plan: 2 and Dexamethasone , Ondansetron , Midazolam  and Treatment may vary due to age or medical condition  Airway Management Planned: LMA  Additional Equipment:   Intra-op Plan:   Post-operative Plan: Extubation in OR  Informed Consent: I have reviewed the patients History and Physical, chart, labs and discussed the procedure including the risks, benefits and alternatives for the proposed anesthesia with the patient or authorized representative who has indicated his/her understanding and acceptance.     Dental Advisory Given  Plan Discussed with: Anesthesiologist, CRNA and Surgeon  Anesthesia Plan Comments:         Anesthesia Quick Evaluation

## 2024-10-29 NOTE — Transfer of Care (Signed)
 Immediate Anesthesia Transfer of Care Note  Patient: [name]  Procedure(s) Performed: DEBRIDEMENT, MENISCUS, KNEE (Left: Knee) ARTHROSCOPY, KNEE (Knee)  Patient Location: PACU  Anesthesia Type:General  Level of Consciousness: awake, alert , and oriented  Airway & Oxygen Therapy: Patient Spontanous Breathing and Patient connected to face mask oxygen  Post-op Assessment: Report given to RN and Post -op Vital signs reviewed and stable  Post vital signs: Reviewed and stable  Last Vitals:  Vitals Value Taken Time  BP 148/75 10/29/24 18:15  Temp    Pulse 78 10/29/24 18:17  Resp 15 10/29/24 18:17  SpO2 100 % 10/29/24 18:17  Vitals shown include unfiled device data.  Last Pain:  Vitals:   10/29/24 1431  TempSrc: Temporal  PainSc: 0-No pain         Complications: No notable events documented.

## 2024-10-30 ENCOUNTER — Encounter: Payer: Self-pay | Admitting: Orthopedic Surgery

## 2024-10-30 NOTE — Anesthesia Postprocedure Evaluation (Signed)
 Anesthesia Post Note  Patient: [name]  Procedure(s) Performed: DEBRIDEMENT, MENISCUS, KNEE (Left: Knee) ARTHROSCOPY, KNEE (Knee)  Patient location during evaluation: PACU Anesthesia Type: General Level of consciousness: awake and alert Pain management: pain level controlled Vital Signs Assessment: post-procedure vital signs reviewed and stable Respiratory status: spontaneous breathing, nonlabored ventilation and respiratory function stable Cardiovascular status: blood pressure returned to baseline and stable Postop Assessment: no apparent nausea or vomiting Anesthetic complications: no   No notable events documented.   Last Vitals:  Vitals:   10/29/24 1846 10/29/24 1900  BP: 138/70 134/72  Pulse: 72 69  Resp: (!) 22 17  Temp:    SpO2: 95% 94%    Last Pain:  Vitals:   10/29/24 1846  TempSrc:   PainSc: 5                  Fairy POUR Mallika Sanmiguel

## 2024-11-01 ENCOUNTER — Inpatient Hospital Stay: Admission: RE | Admit: 2024-11-01 | Discharge: 2024-11-01 | Attending: Internal Medicine | Admitting: Internal Medicine

## 2024-11-01 DIAGNOSIS — Z1231 Encounter for screening mammogram for malignant neoplasm of breast: Secondary | ICD-10-CM | POA: Insufficient documentation

## 2024-11-30 ENCOUNTER — Other Ambulatory Visit: Payer: Self-pay

## 2024-11-30 ENCOUNTER — Ambulatory Visit
Admission: RE | Admit: 2024-11-30 | Discharge: 2024-11-30 | Disposition: A | Discharge status: Home / Self Care | Source: Ambulatory Visit

## 2024-11-30 DIAGNOSIS — M7989 Other specified soft tissue disorders: Secondary | ICD-10-CM | POA: Diagnosis present

## 2024-11-30 DIAGNOSIS — M79661 Pain in right lower leg: Secondary | ICD-10-CM | POA: Diagnosis present
